# Patient Record
Sex: Female | Born: 1940 | Race: White | Hispanic: No | Smoking: Never smoker
Health system: Southern US, Community
[De-identification: ages and names within clinical notes are randomized; demographics above are authoritative.]

## PROBLEM LIST (undated history)

## (undated) DIAGNOSIS — E785 Hyperlipidemia, unspecified: Secondary | ICD-10-CM

## (undated) DIAGNOSIS — Z87442 Personal history of urinary calculi: Secondary | ICD-10-CM

## (undated) DIAGNOSIS — K219 Gastro-esophageal reflux disease without esophagitis: Secondary | ICD-10-CM

## (undated) DIAGNOSIS — M5030 Other cervical disc degeneration, unspecified cervical region: Secondary | ICD-10-CM

## (undated) DIAGNOSIS — IMO0001 Reserved for inherently not codable concepts without codable children: Secondary | ICD-10-CM

## (undated) DIAGNOSIS — M1990 Unspecified osteoarthritis, unspecified site: Secondary | ICD-10-CM

## (undated) HISTORY — DX: Other cervical disc degeneration, unspecified cervical region: M50.30

## (undated) HISTORY — DX: Hyperlipidemia, unspecified: E78.5

## (undated) HISTORY — DX: Gastro-esophageal reflux disease without esophagitis: K21.9

## (undated) HISTORY — DX: Reserved for inherently not codable concepts without codable children: IMO0001

## (undated) HISTORY — PX: OTHER SURGICAL HISTORY: SHX169

## (undated) HISTORY — DX: Unspecified osteoarthritis, unspecified site: M19.90

---

## 1998-01-04 ENCOUNTER — Other Ambulatory Visit: Admission: RE | Admit: 1998-01-04 | Discharge: 1998-01-04 | Payer: Self-pay | Admitting: Emergency Medicine

## 1999-11-28 ENCOUNTER — Other Ambulatory Visit: Admission: RE | Admit: 1999-11-28 | Discharge: 1999-11-28 | Payer: Self-pay | Admitting: Emergency Medicine

## 2000-03-30 ENCOUNTER — Encounter: Admission: RE | Admit: 2000-03-30 | Discharge: 2000-03-30 | Payer: Self-pay | Admitting: Emergency Medicine

## 2000-03-30 ENCOUNTER — Encounter: Payer: Self-pay | Admitting: Emergency Medicine

## 2001-03-29 ENCOUNTER — Other Ambulatory Visit: Admission: RE | Admit: 2001-03-29 | Discharge: 2001-03-29 | Payer: Self-pay | Admitting: Urology

## 2001-04-11 ENCOUNTER — Encounter: Payer: Self-pay | Admitting: Urology

## 2001-04-11 ENCOUNTER — Encounter: Admission: RE | Admit: 2001-04-11 | Discharge: 2001-04-11 | Payer: Self-pay | Admitting: Urology

## 2001-04-13 ENCOUNTER — Ambulatory Visit (HOSPITAL_BASED_OUTPATIENT_CLINIC_OR_DEPARTMENT_OTHER): Admission: RE | Admit: 2001-04-13 | Discharge: 2001-04-13 | Payer: Self-pay | Admitting: Urology

## 2001-04-13 ENCOUNTER — Encounter (INDEPENDENT_AMBULATORY_CARE_PROVIDER_SITE_OTHER): Payer: Self-pay | Admitting: Specialist

## 2001-08-03 HISTORY — PX: CYSTECTOMY: SUR359

## 2001-09-09 ENCOUNTER — Ambulatory Visit (HOSPITAL_COMMUNITY): Admission: RE | Admit: 2001-09-09 | Discharge: 2001-09-09 | Payer: Self-pay | Admitting: *Deleted

## 2003-01-30 ENCOUNTER — Encounter: Admission: RE | Admit: 2003-01-30 | Discharge: 2003-01-30 | Payer: Self-pay | Admitting: Family Medicine

## 2003-01-30 ENCOUNTER — Encounter: Payer: Self-pay | Admitting: Family Medicine

## 2003-08-04 HISTORY — PX: CHOLECYSTECTOMY: SHX55

## 2003-08-04 HISTORY — PX: OTHER SURGICAL HISTORY: SHX169

## 2003-11-30 ENCOUNTER — Encounter: Admission: RE | Admit: 2003-11-30 | Discharge: 2003-11-30 | Payer: Self-pay | Admitting: Family Medicine

## 2004-01-01 ENCOUNTER — Encounter (INDEPENDENT_AMBULATORY_CARE_PROVIDER_SITE_OTHER): Payer: Self-pay | Admitting: Specialist

## 2004-01-01 ENCOUNTER — Observation Stay (HOSPITAL_COMMUNITY): Admission: RE | Admit: 2004-01-01 | Discharge: 2004-01-02 | Payer: Self-pay | Admitting: Surgery

## 2004-06-19 ENCOUNTER — Other Ambulatory Visit: Admission: RE | Admit: 2004-06-19 | Discharge: 2004-06-19 | Payer: Self-pay | Admitting: *Deleted

## 2005-05-19 ENCOUNTER — Ambulatory Visit (HOSPITAL_COMMUNITY): Admission: RE | Admit: 2005-05-19 | Discharge: 2005-05-19 | Payer: Self-pay | Admitting: Family Medicine

## 2005-06-03 ENCOUNTER — Encounter: Admission: RE | Admit: 2005-06-03 | Discharge: 2005-06-03 | Payer: Self-pay | Admitting: Family Medicine

## 2005-08-20 ENCOUNTER — Other Ambulatory Visit: Admission: RE | Admit: 2005-08-20 | Discharge: 2005-08-20 | Payer: Self-pay | Admitting: Obstetrics & Gynecology

## 2006-06-14 ENCOUNTER — Ambulatory Visit (HOSPITAL_BASED_OUTPATIENT_CLINIC_OR_DEPARTMENT_OTHER): Admission: RE | Admit: 2006-06-14 | Discharge: 2006-06-14 | Payer: Self-pay | Admitting: Obstetrics and Gynecology

## 2006-06-14 ENCOUNTER — Encounter (INDEPENDENT_AMBULATORY_CARE_PROVIDER_SITE_OTHER): Payer: Self-pay | Admitting: Specialist

## 2006-10-05 ENCOUNTER — Other Ambulatory Visit: Admission: RE | Admit: 2006-10-05 | Discharge: 2006-10-05 | Payer: Self-pay | Admitting: Obstetrics and Gynecology

## 2006-10-21 ENCOUNTER — Encounter: Admission: RE | Admit: 2006-10-21 | Discharge: 2006-10-21 | Payer: Self-pay | Admitting: Obstetrics and Gynecology

## 2007-11-06 ENCOUNTER — Encounter: Admission: RE | Admit: 2007-11-06 | Discharge: 2007-11-06 | Payer: Self-pay | Admitting: Family Medicine

## 2008-01-02 ENCOUNTER — Encounter: Admission: RE | Admit: 2008-01-02 | Discharge: 2008-01-02 | Payer: Self-pay | Admitting: Obstetrics and Gynecology

## 2008-01-09 ENCOUNTER — Encounter: Admission: RE | Admit: 2008-01-09 | Discharge: 2008-01-24 | Payer: Self-pay | Admitting: Family Medicine

## 2009-09-26 ENCOUNTER — Encounter: Admission: RE | Admit: 2009-09-26 | Discharge: 2009-09-26 | Payer: Self-pay | Admitting: Obstetrics and Gynecology

## 2010-04-25 ENCOUNTER — Encounter: Admission: RE | Admit: 2010-04-25 | Discharge: 2010-04-25 | Payer: Self-pay | Admitting: Podiatry

## 2010-08-23 ENCOUNTER — Encounter: Payer: Self-pay | Admitting: Family Medicine

## 2010-08-24 ENCOUNTER — Encounter: Payer: Self-pay | Admitting: Family Medicine

## 2010-11-14 ENCOUNTER — Other Ambulatory Visit: Payer: Self-pay | Admitting: Obstetrics and Gynecology

## 2010-11-14 DIAGNOSIS — Z1231 Encounter for screening mammogram for malignant neoplasm of breast: Secondary | ICD-10-CM

## 2010-11-24 ENCOUNTER — Other Ambulatory Visit: Payer: Self-pay | Admitting: Emergency Medicine

## 2010-11-24 ENCOUNTER — Ambulatory Visit
Admission: RE | Admit: 2010-11-24 | Discharge: 2010-11-24 | Disposition: A | Discharge status: Home / Self Care | Payer: Medicare Other | Source: Ambulatory Visit | Attending: Obstetrics and Gynecology | Admitting: Obstetrics and Gynecology

## 2010-11-24 DIAGNOSIS — Z1231 Encounter for screening mammogram for malignant neoplasm of breast: Secondary | ICD-10-CM

## 2010-12-19 NOTE — Op Note (Signed)
NAMEVERENICE, WESTRICH              ACCOUNT NO.:  [id number]   MEDICAL RECORD NO.:  [id number]          PATIENT TYPE:  AMB   LOCATION:  NESC                         FACILITY:  [facility]   PHYSICIAN:  Cynthia P. Romine, M.D.DATE OF BIRTH:  [birth year]   DATE OF PROCEDURE:  06/14/2006  DATE OF DISCHARGE:                               OPERATIVE REPORT   PREOPERATIVE DIAGNOSIS:  Endometrial polyp.   POSTOPERATIVE DIAGNOSIS:  Endometrial polyp, path pending.   PROCEDURE:  Hysteroscopic resection of endometrial polyp, dilation and  curettage.   SURGEON:  Dr. Arline Asp Romine.   ANESTHESIA:  General by LMA.   ESTIMATED BLOOD LOSS:  Minimal.   SORBITOL DEFICIT:  105 mL.   COMPLICATIONS:  None.   PROCEDURE:  The patient was taken to the operating room and after  induction of adequate general anesthesia was placed in dorsal lithotomy  position and prepped and draped in usual fashion.  The bladder was  drained with a red rubber catheter.  A posterior weighted and anterior  Sims retractor were placed and the cervix was grasped on its anterior  lip with a single-tooth tenaculum.  The uterus was sounded to 8 cm.  The  cervix was dilated to #31 Shawnie Pons.  The operative hysteroscope was  introduced.  A rather large polyp was noted along the right mid aspect  of the corpus of the uterus.  On ultrasound it had measured 2.4 cm in  greatest dimension and it appeared consistent with that.  There was also  a much smaller polyp along the endocervical canal.  The larger polyp was  removed with multiple passes using the single loop cautery and after the  larger polyp was satisfactorily resected, the small polyp was also  resected, the hysteroscope was removed. sharp curettage was done.  The  specimen was all sent as one specimen to pathology.  The instruments  were removed from vagina and the procedure was terminated.  The patient  tolerated it well.  She went in satisfactory condition to post  anesthesia  recovery.      Cynthia P. Romine, M.D.  Electronically Signed     CPR/MEDQ  D:  06/14/2006  T:  06/14/2006  Job:  [id number]

## 2010-12-19 NOTE — Op Note (Signed)
NAME:  [name], [name]                        ACCOUNT NO.:  [id number]   MEDICAL RECORD NO.:  [id number]                   PATIENT TYPE:  OBV   LOCATION:  0098                                 FACILITY:  [facility]   PHYSICIAN:  Thornton Park. Daphine Deutscher, M.D.             DATE OF BIRTH:  1940-12-27   DATE OF PROCEDURE:  01/01/2004  DATE OF DISCHARGE:                                 OPERATIVE REPORT   PREOPERATIVE DIAGNOSIS:  Cholecystitis, chronic.   POSTOPERATIVE DIAGNOSIS:  Acute and subacute cholecystitis with normal  intraoperative cholangiogram.   PROCEDURE:  Laparoscopic cholecystectomy IOC.   SURGEON:  Thornton Park. Daphine Deutscher, M.D.   ASSISTANT:  Sharlet Salina T. Hoxworth, M.D.   ANESTHESIA:  General endotracheal.   DESCRIPTION OF PROCEDURE:  Cynthia Castro is a 70 year old lady, brought to  [facility] on May 31, given general anesthesia.  The abdomen was  prepped with Betadine and draped sterilely.  We made a transverse incision  through a prior laparoscopy incision and then went into the abdomen without  difficulty.  The abdomen was insufflated.  Three trocars were placed in the  upper abdomen.  The gallbladder was found to be distended, thickened,  whitish, and adherent to surrounding omentum.  This was stripped away, and I  ended up opening the gallbladder fundus with a suction trocar and aspirating  clear bile out.  The gallbladder was then grasped, elevated, and a very  meticulous, tedious dissection ensued in the infundibulum where everything  was really markedly stuck to the duodenum.  This was teased away.  Eventually, I got around the cystic duct and identified the critical view of  the artery and Calot's triangle.  I put a clip up on the gallbladder and  incised the cystic duct, got out some grunge, and inserted the Reddick  catheter, took a dynamic cholangiogram which showed proximal intrahepatic  filling as well as free flow into the duodenum.  Cystic duct was triple  clipped, divided; cystic artery was triple clipped, divided, and then the  gallbladder was removed from the gallbladder bed and although this was  fused, it was very stuck.  Once detached from the gallbladder bed, it was  placed in a bag and brought out through the umbilicus.  This was done by  having to extend my incision a little bit because the gallbladder was so  thickened.  It was inflamed; it had multiple stones.  I cauterized the  gallbladder bed and saw no evidence of bleeding or bile leaks.  I did put a  little piece of Surgicel in there just because the area had been so raw.  The umbilical port was repaired with a figure-of-eight  suture of 0 Vicryl under laparoscopic vision.  I then surveyed the ports,  withdrew them, deflated.  The skin was closed with 4-0 Vicryl with Benzoin  and Steri-Strips.  The patient seemed to tolerate the procedure well and was  taken to the recovery room in satisfactory condition.                                               Thornton Park Daphine Deutscher, M.D.    MBM/MEDQ  D:  01/01/2004  T:  01/01/2004  Job:  [id number]   cc:   Chales Salmon. Abigail Miyamoto, M.D.  [address]  [place]  [place] [address]  Fax: [phone]

## 2010-12-19 NOTE — Op Note (Signed)
[facility]  Patient:    BLANDINA, RENALDO Visit Number: [id number] MRN: [id number]          Service Type: NES Location: NESC Attending Physician:  Katherine Roan Proc. 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  OPERATION PERFORMED:  Cystoscopy, bladder biopsy x 3.  SURGEON:  Rozanna Boer., M.D.  ANESTHESIA:  General.  INDICATIONS FOR PROCEDURE:  This 70 year old white female was admitted with chronic longstanding frequency and urgency with pelvic pain.  She takes trimethoprim p.r.n. sometimes weeks at a time. She had a questionable small clot recently and on cystoscopy in the office she had an inflammatory lesion in the midtrigone and enters for biopsy of this lesion at this time.  DESCRIPTION OF PROCEDURE:  The patient was placed on the operating table in dorsal lithotomy position and after satisfactory induction of general anesthesia was prepped and draped with Betadine in the usual sterile fashion. A 21 panendoscope was inserted and then the bladder carefully inspected using the right angle and Foroblique lens.  The bladder was smooth, nontrabeculated and the only lesion seen was the midtrigone where there was about a 3 to 4 mm lesion in the midtrigone.  It took three biopsies with cold cup biopsy forceps to completely remove this after photographs were taken and the base was fulgurated with the rounded Bugbee electrode.  This effected good hemostasis. The bladder was drained and a BNO suppository inserted and the patient taken to the recovery area in good condition to be later discharged as an outpatient. Attending Physician:  Katherine Roan DD:   04/13/01 TD:  04/13/01 Job: [id number]

## 2011-09-10 ENCOUNTER — Telehealth: Payer: Self-pay

## 2011-09-10 NOTE — Telephone Encounter (Signed)
.  [facility] PT WAS SEEN BY DR [facility] AND WOULD LIKE TO KNOW IF THE RECORDS FROM DR [facility] OFFICE PLEASE CALL [phone]

## 2011-09-14 NOTE — Telephone Encounter (Signed)
Pt wants to make sure that we received her records, I spoke with the patient and let her know that the records were rec'vd and are in her chart

## 2011-09-22 ENCOUNTER — Ambulatory Visit (INDEPENDENT_AMBULATORY_CARE_PROVIDER_SITE_OTHER): Payer: Self-pay | Admitting: Family Medicine

## 2011-09-22 DIAGNOSIS — Z713 Dietary counseling and surveillance: Secondary | ICD-10-CM

## 2011-11-27 ENCOUNTER — Other Ambulatory Visit: Payer: Self-pay | Admitting: Obstetrics and Gynecology

## 2011-11-27 DIAGNOSIS — Z1231 Encounter for screening mammogram for malignant neoplasm of breast: Secondary | ICD-10-CM

## 2011-12-01 ENCOUNTER — Ambulatory Visit: Payer: Medicare Other

## 2011-12-01 ENCOUNTER — Ambulatory Visit
Admission: RE | Admit: 2011-12-01 | Discharge: 2011-12-01 | Disposition: A | Discharge status: Home / Self Care | Payer: Medicare Other | Source: Ambulatory Visit | Attending: Obstetrics and Gynecology | Admitting: Obstetrics and Gynecology

## 2011-12-01 DIAGNOSIS — Z1231 Encounter for screening mammogram for malignant neoplasm of breast: Secondary | ICD-10-CM

## 2012-04-19 ENCOUNTER — Encounter: Payer: Self-pay | Admitting: Family Medicine

## 2012-04-19 ENCOUNTER — Ambulatory Visit: Payer: Medicare Other

## 2012-04-19 ENCOUNTER — Ambulatory Visit (INDEPENDENT_AMBULATORY_CARE_PROVIDER_SITE_OTHER): Payer: Medicare Other | Admitting: Family Medicine

## 2012-04-19 VITALS — BP 112/56 | HR 52 | Temp 98.2°F | Resp 16 | Ht 64.75 in | Wt 176.0 lb

## 2012-04-19 DIAGNOSIS — M545 Low back pain, unspecified: Secondary | ICD-10-CM

## 2012-04-19 DIAGNOSIS — Z0000 Encounter for general adult medical examination without abnormal findings: Secondary | ICD-10-CM

## 2012-04-19 DIAGNOSIS — M419 Scoliosis, unspecified: Secondary | ICD-10-CM

## 2012-04-19 DIAGNOSIS — E663 Overweight: Secondary | ICD-10-CM

## 2012-04-19 DIAGNOSIS — E7800 Pure hypercholesterolemia, unspecified: Secondary | ICD-10-CM

## 2012-04-19 DIAGNOSIS — L989 Disorder of the skin and subcutaneous tissue, unspecified: Secondary | ICD-10-CM

## 2012-04-19 LAB — COMPREHENSIVE METABOLIC PANEL
AST: 16 U/L (ref 0–37)
Albumin: 4.6 g/dL (ref 3.5–5.2)
BUN: 19 mg/dL (ref 6–23)
CO2: 27 mEq/L (ref 19–32)
Calcium: 9.6 mg/dL (ref 8.4–10.5)
Chloride: 107 mEq/L (ref 96–112)
Creat: 0.77 mg/dL (ref 0.50–1.10)
Glucose, Bld: 80 mg/dL (ref 70–99)
Potassium: 4.3 mEq/L (ref 3.5–5.3)
Sodium: 141 mEq/L (ref 135–145)

## 2012-04-19 LAB — CBC WITH DIFFERENTIAL/PLATELET
Basophils Absolute: 0 10*3/uL (ref 0.0–0.1)
Basophils Relative: 1 % (ref 0–1)
Eosinophils Absolute: 0.2 10*3/uL (ref 0.0–0.7)
HCT: 44.5 % (ref 36.0–46.0)
Hemoglobin: 14.9 g/dL (ref 12.0–15.0)
Lymphocytes Relative: 49 % — ABNORMAL HIGH (ref 12–46)
MCH: 29.2 pg (ref 26.0–34.0)
MCHC: 33.5 g/dL (ref 30.0–36.0)
Platelets: 282 10*3/uL (ref 150–400)
RDW: 13.6 % (ref 11.5–15.5)
WBC: 6.5 10*3/uL (ref 4.0–10.5)

## 2012-04-19 LAB — LIPID PANEL
LDL Cholesterol: 135 mg/dL — ABNORMAL HIGH (ref 0–99)
Triglycerides: 126 mg/dL (ref <150)

## 2012-04-19 MED ORDER — DICLOFENAC SODIUM 75 MG PO TBEC: 75.0000 mg | DELAYED_RELEASE_TABLET | Freq: Two times a day (BID) | ORAL | Status: DC

## 2012-04-19 NOTE — Progress Notes (Signed)
Subjective:    Patient ID: Cynthia Castro, female    DOB: 08-28-1940, 71 y.o.   MRN: [id number]  HPI   This 71 y.o. Cauc female is here for CPE; PAP/breast exam done by GYN. 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 Pt has minimal pain  with this and takes Diclofenac 2x /week as needed. She finds tumeric helpful.    Pt is divorced and works part-time as an Programmer, multimedia. Exercise consists of yoga and walking. She is a  nonsmoker but does consume alcohol 1-2 x/week.   MMG: 12/01/2011  Colonoscopy: 01/14/2012    Review of Systems  Constitutional: Negative.   HENT: Positive for neck pain and neck stiffness.        Has C-spine DDD.  Eyes: Negative.        Has exam scheduled for tomorrow.  Respiratory: Negative.   Cardiovascular: Negative.   Gastrointestinal: Negative.   Genitourinary: Negative.   Skin: Negative.        Lesions on back for years- would like to have these evaluated.  Neurological: Negative.   Hematological: Negative.   Psychiatric/Behavioral: Negative.        Objective:   Physical Exam  Constitutional: She is oriented to person, place, and time. She appears well-developed and well-nourished. No distress.  HENT:  Head: Normocephalic and atraumatic.  Right Ear: Hearing, tympanic membrane, external ear and ear canal normal.  Left Ear: Hearing, tympanic membrane, external ear and ear canal normal.  Nose: Nose normal. No nasal deformity or septal deviation.  Mouth/Throat: Uvula is midline, oropharynx is clear and moist and mucous membranes are normal. No oral lesions. Normal dentition.  Eyes: Conjunctivae normal, EOM and lids are normal. Pupils are equal, round, and reactive to light. No scleral icterus.  Neck: Normal range of motion. Neck supple. No thyromegaly present.  Cardiovascular: Normal rate, regular rhythm and normal heart sounds.  Exam reveals no gallop and no friction rub.   No murmur  heard. Pulmonary/Chest: Effort normal and breath sounds normal. No respiratory distress.  Abdominal: Soft. She exhibits no distension, no pulsatile midline mass and no mass. There is no hepatosplenomegaly. There is no tenderness. There is no guarding and no CVA tenderness.  Genitourinary:       Per GYN  Musculoskeletal: Normal range of motion. She exhibits tenderness. She exhibits no edema.       Back-lumbar spine has very firm area to left of midline (feels like a subcutaneous mass)  Lumbar spine- scoliosis  Neck- posterior cervical and supraspinatous muscles tender  Lymphadenopathy:    She has no cervical adenopathy.  Neurological: She is alert and oriented to person, place, and time. She has normal reflexes. No cranial nerve deficit. She exhibits normal muscle tone. Coordination normal.  Skin: Skin is warm and dry. No pallor.       Back- multiple SKs and other smaller pigmented lesions  Psychiatric: She has a normal mood and affect. Her behavior is normal. Judgment and thought content normal.    [facility] reading (PRIMARY) by  Dr. Audria Nine: Lumbar spine- moderately severe scoliosis with deg disc disease at multiple levels.       Assessment & Plan:   1. Routine general medical examination at a health care facility  Comprehensive metabolic panel, CBC with Differential  2. Hypercholesteremia  Lipid panel  3. Low back pain - scoliosis- pt very surprised to have this diagnosis; she wonders why this has not been found sooner but she  is minimally symptomatic DG Lumbar Spine 2-3 Views Reassurance; advised to continue current yoga and other fitness practices as this has been very beneficial for her. Should she become symptomatic, I have suggested PT evaluation.   4. Skin lesions, generalized  Ambulatory referral to Dermatology   Pt given RX: Zostavax x 1 dose.

## 2012-04-19 NOTE — Patient Instructions (Addendum)
Keeping You Healthy  Get These Tests  Blood Pressure- Have your blood pressure checked by your healthcare provider at least once a year.  Normal blood pressure is 120/80.  Weight- Have your body mass index (BMI) calculated to screen for obesity.  BMI is a measure of body fat based on height and weight.  You can calculate your own BMI at https://www.west-esparza.com/  Cholesterol- Have your cholesterol checked every year.  Diabetes- Have your blood sugar checked every year if you have high blood pressure, high cholesterol, a family history of diabetes or if you are overweight.  Pap Smear- Have a pap smear every 1 to 3 years if you have been sexually active.  If you are older than 65 and recent pap smears have been normal you may not need additional pap smears.  In addition, if you have had a hysterectomy  For benign disease additional pap smears are not necessary.  Mammogram-Yearly mammograms are essential for early detection of breast cancer  Screening for Colon Cancer- Colonoscopy starting at age 90. Screening may begin sooner depending on your family history and other health conditions.  Follow up colonoscopy as directed by your Gastroenterologist.  Screening for Osteoporosis- Screening begins at age 2 with bone density scanning, sooner if you are at higher risk for developing Osteoporosis.  Get these medicines  Calcium with Vitamin D- Your body requires 1200-1500 mg of Calcium a day and [phone] IU of Vitamin D a day.  You can only absorb 500 mg of Calcium at a time therefore Calcium must be taken in 2 or 3 separate doses throughout the day.  Hormones- Hormone therapy has been associated with increased risk for certain cancers and heart disease.  Talk to your healthcare provider about if you need relief from menopausal symptoms.  Aspirin- Ask your healthcare provider about taking Aspirin to prevent Heart Disease and Stroke.  Get these Immuniztions  Flu shot- Every fall  Pneumonia  shot- Once after the age of 78; if you are younger ask your healthcare provider if you need a pneumonia shot.  Tetanus- Every ten years.  Zostavax- Once after the age of 54 to prevent shingles. You were given this RX today to [facility] to [facility].  Take these steps  Don't smoke- Your healthcare provider can help you quit. For tips on how to quit, ask your healthcare provider or go to www.smokefree.gov or call 1-800 QUIT-NOW.  Be physically active- Exercise 5 days a week for a minimum of 30 minutes.  If you are not already physically active, start slow and gradually work up to 30 minutes of moderate physical activity.  Try walking, dancing, bike riding, swimming, etc.  Eat a healthy diet- Eat a variety of healthy foods such as fruits, vegetables, whole grains, low fat milk, low fat cheeses, yogurt, lean meats, chicken, fish, eggs, dried beans, tofu, etc.  For more information go to www.thenutritionsource.org  Dental visit- Brush and floss teeth twice daily; visit your dentist twice a year.  Eye exam- Visit your Optometrist or Ophthalmologist yearly.  Drink alcohol in moderation- Limit alcohol intake to one drink or less a day.  Never drink and drive.  Depression- Your emotional health is as important as your physical health.  If you're feeling down or losing interest in things you normally enjoy, please talk to your healthcare provider.  Seat Belts- can save your life; always wear one  Smoke/Carbon Monoxide detectors- These detectors need to be installed on the appropriate level of your home.  Replace batteries at least once a year.  Violence- If anyone is threatening or hurting you, please tell your healthcare provider.  Living Will/ Health care power of attorney- Discuss with your healthcare provider and family.     Shingles Vaccine What You Need to Know WHAT IS SHINGLES?  Shingles is a painful skin rash, often with blisters. It is also called Herpes Zoster or just Zoster.    A shingles rash usually appears on one side of the face or body and lasts from 2 to 4 weeks. Its main symptom is pain, which can be quite severe. Other symptoms of shingles can include fever, headache, chills, and upset stomach. Very rarely, a shingles infection can lead to pneumonia, hearing problems, blindness, brain inflammation (encephalitis), or death.   For about 1 person in 5, severe pain can continue even after the rash clears up. This is called post-herpetic neuralgia.   Shingles is caused by the Varicella Zoster virus. This is the same virus that causes chickenpox. Only someone who has had a case of chickenpox or rarely, has gotten chickenpox vaccine, can get shingles. The virus stays in your body. It can reappear many years later to cause a case of shingles.   You cannot catch shingles from another person with shingles. However, a person who has never had chickenpox (or chickenpox vaccine) could get chickenpox from someone with shingles. This is not very common.   Shingles is far more common in people 22 and older than in younger people. It is also more common in people whose immune systems are weakened because of a disease such as cancer or drugs such as steroids or chemotherapy.   At least 1 million people get shingles per year in the [place].  SHINGLES VACCINE  A vaccine for shingles was licensed in 2006. In clinical trials, the vaccine reduced the risk of shingles by 50%. It can also reduce the pain in people who still get shingles after being vaccinated.   A single dose of shingles vaccine is recommended for adults 12 years of age and older.  SOME PEOPLE SHOULD NOT GET SHINGLES VACCINE OR SHOULD WAIT A person should not get shingles vaccine if he or she:  Has ever had a life-threatening allergic reaction to gelatin, the antibiotic neomycin, or any other component of shingles vaccine. Tell your caregiver if you have any severe allergies.   Has a weakened immune system  because of current:   AIDS or another disease that affects the immune system.   Treatment with drugs that affect the immune system, such as prolonged use of high-dose steroids.   Cancer treatment, such as radiation or chemotherapy.   Cancer affecting the bone marrow or lymphatic system, such as leukemia or lymphoma.   Is pregnant, or might be pregnant. Women should not become pregnant until at least 4 weeks after getting shingles vaccine.  Someone with a minor illness, such as a cold, may be vaccinated. Anyone with a moderate or severe acute illness should usually wait until he or she recovers before getting the vaccine. This includes anyone with a temperature of 101.3 F (38 C) or higher. WHAT ARE THE RISKS FROM SHINGLES VACCINE?  A vaccine, like any medicine, could possibly cause serious problems, such as severe allergic reactions. However, the risk of a vaccine causing serious harm, or death, is extremely small.   No serious problems have been identified with shingles vaccine.  Mild Problems  Redness, soreness, swelling, or itching at the site of the  injection (about 1 person in 3).   Headache (about 1 person in 70).  Like all vaccines, shingles vaccine is being closely monitored for unusual or severe problems. WHAT IF THERE IS A MODERATE OR SEVERE REACTION? What should I look for? Any unusual condition, such as a severe allergic reaction or a high fever. If a severe allergic reaction occurred, it would be within a few minutes to an hour after the shot. Signs of a serious allergic reaction can include difficulty breathing, weakness, hoarseness or wheezing, a fast heartbeat, hives, dizziness, paleness, or swelling of the throat. What should I do?  Call your caregiver, or get the person to a caregiver right away.   Tell the caregiver what happened, the date and time it happened, and when the vaccination was given.   Ask the caregiver to report the reaction by filing a Vaccine  Adverse Event Reporting System (VAERS) form. Or, you can file this report through the VAERS web site at www.vaers.LAgents.no or by calling 1-641-357-0916.  VAERS does not provide medical advice. HOW CAN I LEARN MORE?  Ask your caregiver. He or she can give you the vaccine package insert or suggest other sources of information.   Contact the Centers for Disease Control and Prevention (CDC):   Call 571 389 4811 (1-800-CDC-INFO).   Visit the CDC website at PicCapture.uy  CDC Shingles Vaccine VIS (05/08/08) Document Released: 05/17/2006 Document Revised: 07/09/2011 Document Reviewed: 05/08/2008 [facility] Patient Information 2012 [place], [place].   Scoliosis Scoliosis is the name given to a spine that curves sideways. It is a common condition found in up to ten percent of adolescents. It is more common in teenage girls. This is sometimes the result of other underlying problems such as unequal leg length or muscular problems. Approximately 70% of the time the cause unknown. It can cause twisting of the shoulders, hips, chest, back, and rib cage. Exercises generally do not affect the course of this disease, but may be helpful in strengthening weak muscle groups. Orthopedic braces may be needed during growth spurts. Surgery may be necessary for progressive cases. HOME CARE INSTRUCTIONS   Your caregiver may suggest exercises to strengthen your muscles. Follow their instructions. Ask your caregiver if you can participate in sports activities.   Bracing may be needed to try to limit the progression of the spinal curve. Wear the brace as instructed by your caregiver.   Follow-up appointments are important. Often mild cases of scoliosis can be kept track of by regular physical exams. However, periodic x-rays may be taken in more severe cases to follow the progress of the curvature, especially with brace treatment. Scoliosis can be corrected or improved if treated early.  SEEK IMMEDIATE MEDICAL CARE  IF:  You have back pain that is not relieved by medications prescribed by your caregiver.   If there is weakness or increased muscle tone (spasticity) in your legs or any loss of bowel or bladder control.  Document Released: 07/17/2000 Document Revised: 07/09/2011 Document Reviewed: 08/06/2008 [facility] Patient Information 2012 [place], [place].

## 2012-04-21 ENCOUNTER — Encounter: Payer: Self-pay | Admitting: Family Medicine

## 2012-04-21 DIAGNOSIS — E7800 Pure hypercholesterolemia, unspecified: Secondary | ICD-10-CM | POA: Insufficient documentation

## 2012-04-21 DIAGNOSIS — M419 Scoliosis, unspecified: Secondary | ICD-10-CM | POA: Insufficient documentation

## 2012-04-21 DIAGNOSIS — E663 Overweight: Secondary | ICD-10-CM | POA: Insufficient documentation

## 2012-04-21 NOTE — Progress Notes (Signed)
Quick Note:  Please call pt and advise that the following labs are abnormal...  Chemistries look great but Total cholesterol and LDL ("bad") cholesterol are above normal. Continue to exercise , reduce weight and try to improve your nutrition with low fat, non-processed foods. Increase fruits, vegetables, whole grains, seafood and chicken (not fried) and "good" fats like olive oil and nuts (walnuts, almonds, cashews- if you are not allergic) Get OTC Fish Oil capsule 1200 mg and take 1 daily.  You have an "average" risk for Heart disease with the current cholesterol level. These numbers can be rechecked in 12 months.  Copy to pt. ______

## 2012-04-22 ENCOUNTER — Encounter: Payer: Self-pay | Admitting: *Deleted

## 2012-06-27 ENCOUNTER — Encounter: Payer: Self-pay | Admitting: Family Medicine

## 2012-06-27 DIAGNOSIS — L821 Other seborrheic keratosis: Secondary | ICD-10-CM | POA: Insufficient documentation

## 2012-08-24 ENCOUNTER — Ambulatory Visit (INDEPENDENT_AMBULATORY_CARE_PROVIDER_SITE_OTHER): Payer: Medicare Other | Admitting: Family Medicine

## 2012-08-24 ENCOUNTER — Encounter: Payer: Self-pay | Admitting: Family Medicine

## 2012-08-24 VITALS — BP 107/63 | HR 67 | Temp 98.2°F | Resp 16 | Ht 65.5 in | Wt 174.0 lb

## 2012-08-24 DIAGNOSIS — K5730 Diverticulosis of large intestine without perforation or abscess without bleeding: Secondary | ICD-10-CM

## 2012-08-24 DIAGNOSIS — R1013 Epigastric pain: Secondary | ICD-10-CM

## 2012-08-24 NOTE — Progress Notes (Signed)
S:  This 72 y.o. Cauc female had GI upset with epig pain and loose "orange" stool about 12 days ago. Symptoms lasted about 4 days; pain seemed to migrate to LUQ then resolved. She has been taking licorice product whichhas helped. She takes prescription NSAID maybe twice a week with glass of milk at bedtime. She denies n/v, fever/ chills, diaphoresis, abd pain radiating to back, hematemesis or melena, palpitations, CP or tightness, SOB or DOE, weakness or syncope.  Pt had colonoscopy in 2012 or 2013 performed by Dr. Lanny Hurst diverticulosis in sigmoid colon and internal hemorrhoids).  ROS: As per HPI.   O:  Filed Vitals:   08/24/12 1130  BP: 107/63  Pulse: 67  Temp: 98.2 F (36.8 C)  Resp: 16   GEN: In NAD; WN,WD. HEENT: [place]/AT; EOMI w/ clear conj/sclerae. EACs/TMs normal. Post ph clear w/o erythema or exudate. NECK: No LAN or TMG. COR: RRR; Normal S1,S2. No m,g,r. LUNGS: CTA; normal resp rate and effort. BACK: No CVAT. ABD: Normal appearance, decreased BS w/o guarding, masses or HSM. No epig tenderness. SKIN: W&D; no rashes or pallor. NEURO: A&O x 3; CNs intact. Nonfocal.   ECG: NSR (bradycardia); No ST-TW changes.  A/P: 1. Abdominal pain, acute, epigastric  EKG 12-Lead, H. pylori antibody, IgG  2. Diverticulosis of sigmoid colon      Pt can continue current measures pending lab results. Advised to take NSAID w/ food.

## 2012-08-24 NOTE — Patient Instructions (Addendum)
Your ECG is normal. I suspect you had a GI virus that has been going around Your colonoscopy did show diverticulosis so it is important to be careful about your diet..  Diverticulosis Diverticulosis is a common condition that develops when small pouches (diverticula) form in the wall of the colon. The risk of diverticulosis increases with age. It happens more often in people who eat a low-fiber diet. Most individuals with diverticulosis have no symptoms. Those individuals with symptoms usually experience abdominal pain, constipation, or loose stools (diarrhea). HOME CARE INSTRUCTIONS   Increase the amount of fiber in your diet as directed by your caregiver or dietician. This may reduce symptoms of diverticulosis.  Your caregiver may recommend taking a dietary fiber supplement.  Drink at least 6 to 8 glasses of water each day to prevent constipation.  Try not to strain when you have a bowel movement.  Your caregiver may recommend avoiding nuts and seeds to prevent complications, although this is still an uncertain benefit.  Only take over-the-counter or prescription medicines for pain, discomfort, or fever as directed by your caregiver. FOODS WITH HIGH FIBER CONTENT INCLUDE:  Fruits. Apple, peach, pear, tangerine, raisins, prunes.  Vegetables. Brussels sprouts, asparagus, broccoli, cabbage, carrot, cauliflower, romaine lettuce, spinach, summer squash, tomato, winter squash, zucchini.  Starchy Vegetables. Baked beans, kidney beans, lima beans, split peas, lentils, potatoes (with skin).  Grains. Whole wheat bread, brown rice, bran flake cereal, plain oatmeal, white rice, shredded wheat, bran muffins. SEEK IMMEDIATE MEDICAL CARE IF:   You develop increasing pain or severe bloating.  You have an oral temperature above 102 F (38.9 C), not controlled by medicine.  You develop vomiting or bowel movements that are bloody or black. Document Released: 04/16/2004 Document Revised: 10/12/2011  Document Reviewed: 12/18/2009 [employer] Patient Information 2013 [employer], [place]   Indigestion Indigestion is discomfort in the upper abdomen that is caused by underlying problems such as gastroesophageal reflux disease (GERD), ulcers, or gallbladder problems.  CAUSES  Indigestion can be caused by many things. Possible causes include:  Stomach acid in the esophagus.  Stomach infections, usually caused by the bacteria H. pylori.  Being overweight.  Hiatal hernia. This means part of the stomach pushes up through the diaphragm.  Overeating.  Emotional problems, such as stress, anxiety, or depression.  Poor nutrition.  Consuming too much alcohol, tobacco, or caffeine.  Consuming spicy foods, fats, peppermint, chocolate, tomato products, citrus, or fruit juices.  Medicines such as aspirin and other anti-inflammatory drugs, hormones, steroids, and thyroid medicines.  Gastroparesis. This is a condition in which the stomach does not empty properly.  Stomach cancer.  Pregnancy, due to an increase in hormone levels, a relaxation of muscles in the digestive tract, and pressure on the stomach from the growing fetus. SYMPTOMS   Uncomfortable feeling of fullness after eating.  Pain or burning sensation in the upper abdomen.  Bloating.  Belching and gas.  Nausea and vomiting.  Acidic taste in the mouth.  Burning sensation in the chest (heartburn). DIAGNOSIS  Your caregiver will review your medical history and perform a physical exam. Other tests, such as blood tests, stool tests, X-rays, and other imaging scans, may be done to check for more serious problems. TREATMENT  Liquid antacids and other drugs may be given to block stomach acid secretion. Medicines that increase esophageal muscle tone may also be given to help reduce symptoms. If an infection is found, antibiotic medicine may be given. HOME CARE INSTRUCTIONS  Avoid foods and drinks that make your  symptoms worse, such  as:  Caffeine or alcoholic drinks.  Chocolate.  Peppermint or mint flavorings.  Garlic and onions.  Spicy foods.  Citrus fruits, such as oranges, lemons, or limes.  Tomato-based foods such as sauce, chili, salsa, and pizza.  Fried and fatty foods.  Avoid eating for the 3 hours prior to your bedtime.  Eat small, frequent meals instead of large meals.  Stop smoking if you smoke.  Maintain a healthy weight.  Wear loose-fitting clothing. Do not wear anything tight around your waist that causes pressure on your stomach.  Raise the head of your bed 4 to 8 inches with wood blocks to help you sleep. Extra pillows will not help.  Only take over-the-counter or prescription medicines as directed by your caregiver.  Do not take aspirin, ibuprofen, or other nonsteroidal anti-inflammatory drugs (NSAIDs). SEEK IMMEDIATE MEDICAL CARE IF:   You are not better after 2 days.  You have chest pressure or pain that radiates up into your neck, arms, back, jaw, or upper abdomen.  You have difficulty swallowing.  You keep vomiting.  You have black or bloody stools.  You have a fever.  You have dizziness, fainting, difficulty breathing, or heavy sweating.  You have severe abdominal pain.  You lose weight without trying. MAKE SURE YOU:  Understand these instructions.  Will watch your condition.  Will get help right away if you are not doing well or get worse. Document Released: 08/27/2004 Document Revised: 10/12/2011 Document Reviewed: 03/04/2011 [facility] Patient Information 2013 [place], [place].

## 2012-08-25 LAB — H. PYLORI ANTIBODY, IGG: H Pylori IgG: 0.8 {ISR}

## 2012-08-25 NOTE — Progress Notes (Signed)
Quick Note:  Please call pt and advise that the lab test for H. Pylori bacteria (found in stomach) is Negative.   She can continue with recommended OTC medication and diet modifications as addressed in handout.   Copy to pt.  ______

## 2012-12-21 ENCOUNTER — Encounter: Payer: Self-pay | Admitting: *Deleted

## 2012-12-22 ENCOUNTER — Ambulatory Visit (INDEPENDENT_AMBULATORY_CARE_PROVIDER_SITE_OTHER): Payer: Medicare Other | Admitting: Obstetrics and Gynecology

## 2012-12-22 ENCOUNTER — Encounter: Payer: Self-pay | Admitting: Nurse Practitioner

## 2012-12-22 VITALS — BP 114/60 | HR 68 | Ht 66.0 in | Wt 178.4 lb

## 2012-12-22 DIAGNOSIS — Z23 Encounter for immunization: Secondary | ICD-10-CM

## 2012-12-22 DIAGNOSIS — N812 Incomplete uterovaginal prolapse: Secondary | ICD-10-CM

## 2012-12-22 DIAGNOSIS — N952 Postmenopausal atrophic vaginitis: Secondary | ICD-10-CM

## 2012-12-22 DIAGNOSIS — N816 Rectocele: Secondary | ICD-10-CM | POA: Insufficient documentation

## 2012-12-22 DIAGNOSIS — R350 Frequency of micturition: Secondary | ICD-10-CM

## 2012-12-22 DIAGNOSIS — Z01419 Encounter for gynecological examination (general) (routine) without abnormal findings: Secondary | ICD-10-CM

## 2012-12-22 MED ORDER — ESTROGENS, CONJUGATED 0.625 MG/GM VA CREA: TOPICAL_CREAM | Freq: Every day | VAGINAL | Status: DC

## 2012-12-22 NOTE — Progress Notes (Signed)
72 y.o. G4P3 Married Caucasian Fe here for annual exam.    Patient stopped vaginal estrogen cream but felt pressure vaginally so she restarted.  Doing Kegels. Urinating at night and voids only small amounts.  1 - 4 times a night. Urinary frequency during the day varies. No dysuria. No hematuria. Can have some discomfort in left lower quadrant, most often relieved by bowel movements.   Bowel movements relieve the pressure feeling.  No constipation. Patient had a known cystocele.    PCP told her she has scoliosis.    Patient had an episode of fleeting pain in left side when was at a a Ambulance person.  Was strong pain but has been nonrecurrent.    No LMP recorded. Patient is postmenopausal.          Sexually active: no  The current method of family planning is post menopausal status.    Exercising: yes  yoga and walk Smoker:  no  Health Maintenance: Pap:  10/31/2009  Normal .  No history of abnormal pap smears. MMG:  12/2011 Colonoscopy:  01/2011 normal BMD:   2009 TDaP:  PCP updates TDaP, per pt.   Patient unsure when it was last performed.   Labs: PCP does lab (blood) work.    reports that she has never smoked. She has never used smokeless tobacco. She reports that she drinks about 1.2 ounces of alcohol per week.  Past Medical History  Diagnosis Date  . Arthritis   . Hyperlipidemia   . DDD (degenerative disc disease), cervical     Past Surgical History  Procedure Laterality Date  . Cholecystectomy  2005  . Cesarean section  1984  . Cystectomy  2003  . Uterine cyst removal  2005  . Hysteroscopic resection      hysteroscopic resection of polyp    Current Outpatient Prescriptions  Medication Sig Dispense Refill  . Estrogens, Conjugated (PREMARIN VA) Place vaginally. USE ONCE A WEEK      . fish oil-omega-3 fatty acids 1000 MG capsule Take 2 g by mouth daily.      . TURMERIC PO Take by mouth. CAPSULE      . diclofenac (FLECTOR) 1.3 % PTCH Place 1 patch onto the skin as  needed.      . diclofenac (VOLTAREN) 75 MG EC tablet Take 1 tablet (75 mg total) by mouth 2 (two) times daily.  60 tablet  5  . folic acid (FOLVITE) 1 MG tablet Take 1 mg by mouth daily.      . Multiple Vitamins-Minerals (MULTIVITAMIN PO) Take by mouth daily.       No current facility-administered medications for this visit.    Family History  Problem Relation Age of Onset  . Diabetes Mother   . Heart disease Mother     CHF  . Vision loss Mother   . Cancer Father     TESTICULAR  . Stroke Father 64  . Diabetes Maternal Grandmother   . Heart disease Maternal Grandfather     ROS:  Pertinent items are noted in HPI.  Otherwise, a comprehensive ROS was negative.  Exam:   BP 114/60  Pulse 68  Ht 5\' 6"  (1.676 m)  Wt 178 lb 6.4 oz (80.922 kg)  BMI 28.81 kg/m2 Height: 5\' 6"  (167.6 cm)  Ht Readings from Last 3 Encounters:  12/22/12 5\' 6"  (1.676 m)  08/24/12 5' 5.5" (1.664 m)  04/19/12 5' 4.75" (1.645 m)    General appearance: alert, cooperative and appears stated age  Head: Normocephalic, without obvious abnormality, atraumatic Neck: no adenopathy, supple, symmetrical, trachea midline and thyroid normal to inspection and palpation Lungs: clear to auscultation bilaterally Breasts: normal appearance, no masses or tenderness, No nipple retraction or dimpling, No axillary or supraclavicular adenopathy Heart: regular rate and rhythm Abdomen: soft, non-tender; no masses,  no organomegaly Extremities: extremities normal, atraumatic, no cyanosis or edema Skin: Skin color, texture, turgor normal. No rashes or lesions Lymph nodes: Cervical, supraclavicular, and axillary nodes normal. No abnormal inguinal nodes palpated Neurologic: Grossly normal   Pelvic: External genitalia:  no lesions              Urethra:  normal appearing urethra with no masses, tenderness or lesions              Bartholin's and Skene's: normal                 Vagina: normal appearing vagina with erythema of vaginal  cuff, no lesions.  Second degree rectocele.  Good anterior vaginal support.              Cervix: no lesions              Pap taken: no Bimanual Exam:  Uterus:  normal size, contour, position, consistency, mobility, non-tender              Adnexa: normal adnexa and no mass, fullness, tenderness               Rectovaginal: Confirms               Anus:  normal sphincter tone, no lesions  A:  Well Woman with normal exam Atrophic vaginitis Urinary frequency Incomplete uterovaginal prolapse.  Second degree rectocele noted.  P:   Pap smear not needed per guidelines  Mammogram ordered.  Patient will call to schedule. Premarin Rx.  See Epic orders TDap today. Discussion with patient regarding pelvic organ prolapse, etiologies, symptom control and treatment option.  ACOG handout also given to patient. Discussed bowel regimens to avoid straining. Discussed bladder irritants with patient. return annually or prn  An After Visit Summary was printed and given to the patient.

## 2012-12-22 NOTE — Patient Instructions (Addendum)
EXERCISE AND DIET:  We recommended that you start or continue a regular exercise program for good health. Regular exercise means any activity that makes your heart beat faster and makes you sweat.  We recommend exercising at least 30 minutes per day at least 3 days a week, preferably 4 or 5.  We also recommend a diet low in fat and sugar.  Inactivity, poor dietary choices and obesity can cause diabetes, heart attack, stroke, and kidney damage, among others.    ALCOHOL AND SMOKING:  Women should limit their alcohol intake to no more than 7 drinks/beers/glasses of wine (combined, not each!) per week. Moderation of alcohol intake to this level decreases your risk of breast cancer and liver damage. And of course, no recreational drugs are part of a healthy lifestyle.  And absolutely no smoking or even second hand smoke. Most people know smoking can cause heart and lung diseases, but did you know it also contributes to weakening of your bones? Aging of your skin?  Yellowing of your teeth and nails?  CALCIUM AND VITAMIN D:  Adequate intake of calcium and Vitamin D are recommended.  The recommendations for exact amounts of these supplements seem to change often, but generally speaking 600 mg of calcium (either carbonate or citrate) and 800 units of Vitamin D per day seems prudent. Certain women may benefit from higher intake of Vitamin D.  If you are among these women, your doctor will have told you during your visit.    PAP SMEARS:  Pap smears, to check for cervical cancer or precancers,  have traditionally been done yearly, although recent scientific advances have shown that most women can have pap smears less often.  However, every woman still should have a physical exam from her gynecologist every year. It will include a breast check, inspection of the vulva and vagina to check for abnormal growths or skin changes, a visual exam of the cervix, and then an exam to evaluate the size and shape of the uterus and  ovaries.  And after 71 years of age, a rectal exam is indicated to check for rectal cancers. We will also provide age appropriate advice regarding health maintenance, like when you should have certain vaccines, screening for sexually transmitted diseases, bone density testing, colonoscopy, mammograms, etc.   MAMMOGRAMS:  All women over 40 years old should have a yearly mammogram. Many facilities now offer a "3D" mammogram, which may cost around $50 extra out of pocket. If possible,  we recommend you accept the option to have the 3D mammogram performed.  It both reduces the number of women who will be called back for extra views which then turn out to be normal, and it is better than the routine mammogram at detecting truly abnormal areas.    COLONOSCOPY:  Colonoscopy to screen for colon cancer is recommended for all women at age 50.  We know, you hate the idea of the prep.  We agree, BUT, having colon cancer and not knowing it is worse!!  Colon cancer so often starts as a polyp that can be seen and removed at colonscopy, which can quite literally save your life!  And if your first colonoscopy is normal and you have no family history of colon cancer, most women don't have to have it again for 10 years.  Once every ten years, you can do something that may end up saving your life, right?  We will be happy to help you get it scheduled when you are ready.    Be sure to check your insurance coverage so you understand how much it will cost.  It may be covered as a preventative service at no cost, but you should check your particular policy.    Tetanus, Diphtheria, Pertussis (Tdap) Vaccine What You Need to Know WHY GET VACCINATED? Tetanus, diphtheria and pertussis can be very serious diseases, even for adolescents and adults. Tdap vaccine can protect us from these diseases. TETANUS (Lockjaw) causes painful muscle tightening and stiffness, usually all over the body.  It can lead to tightening of muscles in the head  and neck so you can't open your mouth, swallow, or sometimes even breathe. Tetanus kills about 1 out of 5 people who are infected. DIPHTHERIA can cause a thick coating to form in the back of the throat.  It can lead to breathing problems, paralysis, heart failure, and death. PERTUSSIS (Whooping Cough) causes severe coughing spells, which can cause difficulty breathing, vomiting and disturbed sleep.  It can also lead to weight loss, incontinence, and rib fractures. Up to 2 in 100 adolescents and 5 in 100 adults with pertussis are hospitalized or have complications, which could include pneumonia and death. These diseases are caused by bacteria. Diphtheria and pertussis are spread from person to person through coughing or sneezing. Tetanus enters the body through cuts, scratches, or wounds. Before vaccines, the [place] saw as many as 200,000 cases a year of diphtheria and pertussis, and hundreds of cases of tetanus. Since vaccination began, tetanus and diphtheria have dropped by about 99% and pertussis by about 80%. TDAP VACCINE Tdap vaccine can protect adolescents and adults from tetanus, diphtheria, and pertussis. One dose of Tdap is routinely given at age 11 or 12. People who did not get Tdap at that age should get it as soon as possible. Tdap is especially important for health care professionals and anyone having close contact with a baby younger than 12 months. Pregnant women should get a dose of Tdap during every pregnancy, to protect the newborn from pertussis. Infants are most at risk for severe, life-threatening complications from pertussis. A similar vaccine, called Td, protects from tetanus and diphtheria, but not pertussis. A Td booster should be given every 10 years. Tdap may be given as one of these boosters if you have not already gotten a dose. Tdap may also be given after a severe cut or burn to prevent tetanus infection. Your doctor can give you more information. Tdap may safely  be given at the same time as other vaccines. SOME PEOPLE SHOULD NOT GET THIS VACCINE  If you ever had a life-threatening allergic reaction after a dose of any tetanus, diphtheria, or pertussis containing vaccine, OR if you have a severe allergy to any part of this vaccine, you should not get Tdap. Tell your doctor if you have any severe allergies.  If you had a coma, or long or multiple seizures within 7 days after a childhood dose of DTP or DTaP, you should not get Tdap, unless a cause other than the vaccine was found. You can still get Td.  Talk to your doctor if you:  have epilepsy or another nervous system problem,  had severe pain or swelling after any vaccine containing diphtheria, tetanus or pertussis,  ever had Guillain-Barr Syndrome (GBS),  aren't feeling well on the day the shot is scheduled. RISKS OF A VACCINE REACTION With any medicine, including vaccines, there is a chance of side effects. These are usually mild and go away on their own, but serious   reactions are also possible. Brief fainting spells can follow a vaccination, leading to injuries from falling. Sitting or lying down for about 15 minutes can help prevent these. Tell your doctor if you feel dizzy or light-headed, or have vision changes or ringing in the ears. Mild problems following Tdap (Did not interfere with activities)  Pain where the shot was given (about 3 in 4 adolescents or 2 in 3 adults)  Redness or swelling where the shot was given (about 1 person in 5)  Mild fever of at least 100.67F (up to about 1 in 25 adolescents or 1 in 100 adults)  Headache (about 3 or 4 people in 10)  Tiredness (about 1 person in 3 or 4)  Nausea, vomiting, diarrhea, stomach ache (up to 1 in 4 adolescents or 1 in 10 adults)  Chills, body aches, sore joints, rash, swollen glands (uncommon) Moderate problems following Tdap (Interfered with activities, but did not require medical attention)  Pain where the shot was given  (about 1 in 5 adolescents or 1 in 100 adults)  Redness or swelling where the shot was given (up to about 1 in 16 adolescents or 1 in 25 adults)  Fever over 102F (about 1 in 100 adolescents or 1 in 250 adults)  Headache (about 3 in 20 adolescents or 1 in 10 adults)  Nausea, vomiting, diarrhea, stomach ache (up to 1 or 3 people in 100)  Swelling of the entire arm where the shot was given (up to about 3 in 100). Severe problems following Tdap (Unable to perform usual activities, required medical attention)  Swelling, severe pain, bleeding and redness in the arm where the shot was given (rare). A severe allergic reaction could occur after any vaccine (estimated less than 1 in a million doses). WHAT IF THERE IS A SERIOUS REACTION? What should I look for?  Look for anything that concerns you, such as signs of a severe allergic reaction, very high fever, or behavior changes. Signs of a severe allergic reaction can include hives, swelling of the face and throat, difficulty breathing, a fast heartbeat, dizziness, and weakness. These would start a few minutes to a few hours after the vaccination. What should I do?  If you think it is a severe allergic reaction or other emergency that can't wait, call 9-1-1 or get the person to the nearest hospital. Otherwise, call your doctor.  Afterward, the reaction should be reported to the "Vaccine Adverse Event Reporting System" (VAERS). Your doctor might file this report, or you can do it yourself through the VAERS web site at www.vaers.LAgents.no, or by calling 1-3806351896. VAERS is only for reporting reactions. They do not give medical advice.  THE NATIONAL VACCINE INJURY COMPENSATION PROGRAM The National Vaccine Injury Compensation Program (VICP) is a federal program that was created to compensate people who may have been injured by certain vaccines. Persons who believe they may have been injured by a vaccine can learn about the program and about filing a  claim by calling 1-506-604-1137 or visiting the VICP website at SpiritualWord.at. HOW CAN I LEARN MORE?  Ask your doctor.  Call your local or state health department.  Contact the Centers for Disease Control and Prevention (CDC):  Call (412)429-2646 or visit CDC's website at PicCapture.uy. CDC Tdap Vaccine VIS (12/10/11) Document Released: 01/19/2012 Document Revised: 04/13/2012 Document Reviewed: 01/19/2012 [employer] Patient Information 2014 [place], [place]. Urinary Frequency The number of times a normal person urinates depends upon how much liquid they take in and how much liquid they  are losing. If the temperature is hot and there is high humidity then the person will sweat more and usually breathe a little more frequently. These factors decrease the amount of frequency of urination that would be considered normal. The amount you drink is easily determined, but the amount of fluid lost is sometimes more difficult to calculate.  Fluid is lost in two ways:  Sensible fluid loss is usually measured by the amount of urine that you get rid of. Losses of fluid can also occur with diarrhea.  Insensible fluid loss is more difficult to measure. It is caused by evaporation. Insensible loss of fluid occurs through breathing and sweating. It usually ranges from a little less than a quart to a little more than a quart of fluid a day. In normal temperatures and activity levels the average person may urinate 4 to 7 times in a 24-hour period. Needing to urinate more often than that could indicate a problem. If one urinates 4 to 7 times in 24 hours and has large volumes each time, that could indicate a different problem from one who urinates 4 to 7 times a day and has small volumes. The time of urinating is also an important. Most urinating should be done during the waking hours. Getting up at night to urinate frequently can indicate some problems. CAUSES  The bladder is the organ in  your lower abdomen that holds urine. Like a balloon, it swells some as it fills up. Your nerves sense this and tell you it is time to head for the bathroom. There are a number of reasons that you might feel the need to urinate more often than usual. They include:  Urinary tract infection. This is usually associated with other signs such as burning when you urinate.  In men, problems with the prostate (a walnut-size gland that is located near the tube that carries urine out of your body). There are two reasons why the prostate can cause an increased frequency of urination:  An enlarged prostate that does not let the bladder empty well. If the bladder only half empties when you urinate then it only has half the capacity to fill before you have to urinate again.  The nerves in the bladder become more hypersensitive with an increased size of the prostate even if the bladder empties completely.  Pregnancy.  Obesity. Excess weight is more likely to cause a problem for women more than for men.  Bladder stones or other bladder problems.  Caffeine.  Alcohol.  Medications. For example, drugs that help the body get rid of extra fluid (diuretics) increase urine production. Some other medicines must be taken with lots of fluids.  Muscle or nerve weakness. This might be the result of a spinal cord injury, a stroke, multiple sclerosis or Parkinson's disease.  Long-standing diabetes can decrease the sensation of the bladder. This loss of sensation makes it harder to sense the bladder needs to be emptied. Over a period of years the bladder is stretched out by constant overfilling. This weakens the bladder muscles so that the bladder does not empty well and has less capacity to fill with new urine.  Interstitial cystitis (also called painful bladder syndrome). This condition develops because the tissues that line the insider of the bladder are inflamed (inflammation is the body's way of reacting to injury or  infection). It causes pain and frequent urination. It occurs in women more often than in men. DIAGNOSIS   To decide what might be causing your  urinary frequency, your healthcare provider will probably:  Ask about symptoms you have noticed.  Ask about your overall health. This will include questions about any medications you are taking.  Do a physical examination.  Order some tests. These might include:  A blood test to check for diabetes or other health issues that could be contributing to the problem.  Urine testing. This could measure the flow of urine and the pressure on the bladder.  A test of your neurological system (the brain, spinal cord and nerves). This is the system that senses the need to urinate.  A bladder test to check whether it is emptying completely when you urinate.  Cytoscopy. This test uses a thin tube with a tiny camera on it. It offers a look inside your urethra and bladder to see if there are problems.  Imaging tests. You might be given a contrast dye and then asked to urinate. X-rays are taken to see how your bladder is working. TREATMENT  It is important for you to be evaluated to determine if the amount or frequency that you have is unusual or abnormal. If it is found to be abnormal the cause should be determined and this can usually be found out easily. Depending upon the cause treatment could include medication, stimulation of the nerves, or surgery. There are not too many things that you can do as an individual to change your urinary frequency. It is important that you balance the amount of fluid intake needed to compensate for your activity and the temperature. Medical problems will be diagnosed and taken care of by your physician. There is no particular bladder training such as Kegel's exercises that you can do to help urinary frequency. This is an exercise this is usually done for people who have leaking of urine when they laugh cough or sneeze. HOME CARE  INSTRUCTIONS   Take any medications your healthcare provider prescribed or suggested. Follow the directions carefully.  Practice any lifestyle changes that are recommended. These might include:  Drinking less fluid or drinking at different times of the day. If you need to urinate often during the night, for example, you may need to stop drinking fluids early in the evening.  Cutting down on caffeine or alcohol. They both can make you need to urinate more often than normal. Caffeine is found in coffee, tea and sodas.  Losing weight, if that is recommended.  Keep a journal or a log. You might be asked to record how much you drink and when and when you feel the need to urinate. This will also help evaluate how well the treatment provided by your physician is working. SEEK MEDICAL CARE IF:   Your need to urinate often gets worse.  You feel increased pain or irritation when you urinate.  You notice blood in your urine.  You have questions about any medications that your healthcare provider recommended.  You notice blood, pus or swelling at the site of any test or treatment procedure.  You develop a fever of more than 100.5 F (38.1 C). SEEK IMMEDIATE MEDICAL CARE IF:  You develop a fever of more than 102.0 F (38.9 C). Document Released: 05/16/2009 Document Revised: 10/12/2011 Document Reviewed: 05/16/2009 [facility] Patient Information 2014 [place], [place].

## 2012-12-30 ENCOUNTER — Ambulatory Visit
Admission: RE | Admit: 2012-12-30 | Discharge: 2012-12-30 | Disposition: A | Discharge status: Home / Self Care | Payer: Medicare Other | Source: Ambulatory Visit | Attending: Obstetrics and Gynecology | Admitting: Obstetrics and Gynecology

## 2012-12-30 ENCOUNTER — Ambulatory Visit: Payer: Medicare Other

## 2012-12-30 DIAGNOSIS — Z01419 Encounter for gynecological examination (general) (routine) without abnormal findings: Secondary | ICD-10-CM

## 2013-04-03 HISTORY — PX: LITHOTRIPSY: SUR834

## 2013-05-03 DIAGNOSIS — Z87442 Personal history of urinary calculi: Secondary | ICD-10-CM

## 2013-05-03 HISTORY — DX: Personal history of urinary calculi: Z87.442

## 2013-05-05 ENCOUNTER — Ambulatory Visit (INDEPENDENT_AMBULATORY_CARE_PROVIDER_SITE_OTHER): Payer: Medicare Other | Admitting: Family Medicine

## 2013-05-05 ENCOUNTER — Encounter: Payer: Self-pay | Admitting: Family Medicine

## 2013-05-05 VITALS — BP 100/60 | HR 55 | Temp 98.6°F | Resp 16 | Ht 65.0 in | Wt 176.8 lb

## 2013-05-05 DIAGNOSIS — R39198 Other difficulties with micturition: Secondary | ICD-10-CM

## 2013-05-05 DIAGNOSIS — R3989 Other symptoms and signs involving the genitourinary system: Secondary | ICD-10-CM

## 2013-05-05 DIAGNOSIS — L989 Disorder of the skin and subcutaneous tissue, unspecified: Secondary | ICD-10-CM

## 2013-05-05 DIAGNOSIS — R3129 Other microscopic hematuria: Secondary | ICD-10-CM

## 2013-05-05 DIAGNOSIS — Z23 Encounter for immunization: Secondary | ICD-10-CM

## 2013-05-05 DIAGNOSIS — Z0000 Encounter for general adult medical examination without abnormal findings: Secondary | ICD-10-CM

## 2013-05-05 DIAGNOSIS — R5381 Other malaise: Secondary | ICD-10-CM

## 2013-05-05 LAB — POCT UA - MICROSCOPIC ONLY
Bacteria, U Microscopic: NEGATIVE
Casts, Ur, LPF, POC: NEGATIVE
Mucus, UA: NEGATIVE

## 2013-05-05 LAB — BASIC METABOLIC PANEL
BUN: 17 mg/dL (ref 6–23)
Calcium: 9.2 mg/dL (ref 8.4–10.5)
Chloride: 108 mEq/L (ref 96–112)
Creat: 0.79 mg/dL (ref 0.50–1.10)
Potassium: 4.5 mEq/L (ref 3.5–5.3)

## 2013-05-05 LAB — LIPID PANEL
HDL: 47 mg/dL (ref >39)
LDL Cholesterol: 134 mg/dL — ABNORMAL HIGH (ref 0–99)
Total CHOL/HDL Ratio: 4.3 Ratio
Triglycerides: 115 mg/dL (ref <150)
VLDL: 23 mg/dL (ref 0–40)

## 2013-05-05 LAB — POCT URINALYSIS DIPSTICK
Bilirubin, UA: NEGATIVE
Ketones, UA: NEGATIVE
Protein, UA: NEGATIVE
Spec Grav, UA: 1.015
pH, UA: 5.5

## 2013-05-05 MED ORDER — ZOSTER VACCINE LIVE 19400 UNT/0.65ML ~~LOC~~ SOLR: 0.6500 mL | Freq: Once | SUBCUTANEOUS | Status: DC

## 2013-05-05 MED ORDER — ESTROGENS, CONJUGATED 0.625 MG/GM VA CREA: TOPICAL_CREAM | Freq: Every day | VAGINAL | Status: DC

## 2013-05-05 MED ORDER — DICLOFENAC SODIUM 75 MG PO TBEC: 75.0000 mg | DELAYED_RELEASE_TABLET | Freq: Two times a day (BID) | ORAL | Status: DC

## 2013-05-05 NOTE — Progress Notes (Signed)
Subjective:    Patient ID: Cynthia Castro, female    DOB: 04-16-1941, 72 y.o.   MRN: [id number]  HPI  This 72 y.o. Cauc female is here for CPE; she wants referral to [facility] for eval of skin lesion on forehead, present for years. She was seen by PA-C at Dr. Sherryl Barters office for skin surveillance and eval of other lesions. Pt wants to go to another [facility] practice for second opinion. Otherwise, pt is concerned about preventing Diabetes. She has some mental fogginess but no significant cognitive dysfunction.  Patient Active Problem List   Diagnosis Date Noted  . Rectocele 12/22/2012  . Diverticulosis of sigmoid colon 08/24/2012  . Seborrheic keratosis 06/27/2012  . Hypercholesteremia 04/21/2012  . Scoliosis of lumbar spine 04/21/2012  . Overweight (BMI 25.0-29.9) 04/21/2012   PMHx, Soc Hx and Fam Hx reviewed.   Review of Systems  Constitutional: Negative.   HENT: Negative.   Eyes: Negative.   Respiratory: Negative.   Cardiovascular: Negative.   Gastrointestinal: Negative.   Endocrine: Negative.   Genitourinary: Positive for urgency, frequency and difficulty urinating.       Symptoms present for > 1 year.  Musculoskeletal: Negative.   Skin: Negative.   Allergic/Immunologic: Negative.   Neurological: Negative.   Hematological: Negative.   Psychiatric/Behavioral: Positive for sleep disturbance.       Objective:   Physical Exam  Nursing note and vitals reviewed. Constitutional: She is oriented to person, place, and time. Vital signs are normal. She appears well-developed and well-nourished. No distress.  HENT:  Head: Normocephalic and atraumatic.  Right Ear: Hearing, external ear and ear canal normal. Tympanic membrane is scarred.  Left Ear: Hearing, external ear and ear canal normal. Tympanic membrane is scarred.  Nose: Nose normal. No mucosal edema, rhinorrhea, nasal deformity or septal deviation. Right sinus exhibits no maxillary sinus tenderness and no frontal sinus  tenderness. Left sinus exhibits no frontal sinus tenderness.  Mouth/Throat: Uvula is midline, oropharynx is clear and moist and mucous membranes are normal. No oral lesions. Normal dentition. No dental caries.  Eyes: Conjunctivae, EOM and lids are normal. Pupils are equal, round, and reactive to light. No scleral icterus.  Fundoscopic exam:      The right eye shows no papilledema. The right eye shows red reflex.       The left eye shows no papilledema. The left eye shows red reflex.  Vision assessment performed annually by  [facility] professionally.  Neck: Normal range of motion and full passive range of motion without pain. Neck supple. No JVD present. No spinous process tenderness and no muscular tenderness present. Carotid bruit is not present. No mass and no thyromegaly present.  Cardiovascular: Normal rate, regular rhythm, S1 normal, S2 normal, normal heart sounds and normal pulses.   No extrasystoles are present. PMI is not displaced.  Exam reveals no gallop and no friction rub.   No murmur heard. Pulmonary/Chest: Effort normal and breath sounds normal. No respiratory distress. Right breast exhibits no inverted nipple, no mass, no nipple discharge, no skin change and no tenderness. Left breast exhibits no inverted nipple, no mass, no nipple discharge, no skin change and no tenderness. Breasts are symmetrical.  Abdominal: Soft. Normal appearance. She exhibits no shifting dullness, no distension, no pulsatile midline mass and no mass. Bowel sounds are decreased. There is no hepatosplenomegaly. There is no tenderness. There is no guarding and no CVA tenderness.  Genitourinary:  NEFG; no exam performed as pt sees GYN and had pelvic.  Musculoskeletal: Normal range of motion. She exhibits no edema and no tenderness.  Lymphadenopathy:       Head (right side): No submental, no submandibular, no tonsillar, no posterior auricular and no occipital adenopathy present.       Head (left side): No submental,  no submandibular, no tonsillar, no posterior auricular and no occipital adenopathy present.    She has no cervical adenopathy.    She has no axillary adenopathy.       Right: No inguinal and no supraclavicular adenopathy present.       Left: No inguinal and no supraclavicular adenopathy present.  Neurological: She is alert and oriented to person, place, and time. She has normal strength and normal reflexes. She is not disoriented. She displays no atrophy and no tremor. No cranial nerve deficit or sensory deficit. She exhibits normal muscle tone. Coordination and gait normal.  Skin: Skin is warm, dry and intact. Lesion noted. No ecchymosis and no rash noted. She is not diaphoretic. No cyanosis or erythema. No pallor. Nails show no clubbing.  L forehead- 1 cm flesh-colored macule w/ slight scaliness.  Psychiatric: She has a normal mood and affect. Her speech is normal and behavior is normal. Judgment and thought content normal. Cognition and memory are normal.       Assessment & Plan:  Routine general medical examination at a health care facility - Plan: POCT glycosylated hemoglobin (Hb A1C), Lipid panel, Vit D  25 hydroxy (rtn osteoporosis monitoring), Basic metabolic panel, POCT urinalysis dipstick, POCT UA - Microscopic Only  Difficulty urinating - Discussed Urologic eval but pt prefers to watch and wait. I suspect she may have some degree of Interstitial cystitis.           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 Continue estrogen vaginal cream.  Other malaise and fatigue - Plan: T3, free, T4, free, TSH, Vit D  25 hydroxy (rtn osteoporosis monitoring)  Skin lesion of face - Pt has multiple Seb Ks on trunk and ext.     Plan: Ambulatory referral to Dermatology  Need for shingles vaccine - Plan: zoster vaccine live, PF, (ZOSTAVAX) 16109 UNT/0.65ML injection  Need for prophylactic vaccination and inoculation against influenza - Plan: Flu Vaccine  QUAD 36+ mos IM

## 2013-05-05 NOTE — Patient Instructions (Addendum)

## 2013-05-05 NOTE — Progress Notes (Deleted)
  Subjective:    Patient ID: Cynthia Castro, female    DOB: 1940-12-20, 72 y.o.   MRN: [id number]  HPI    Review of Systems     Objective:   Physical Exam        Assessment & Plan:

## 2013-05-06 LAB — T4, FREE: Free T4: 1.22 ng/dL (ref 0.80–1.80)

## 2013-05-09 ENCOUNTER — Encounter: Payer: Self-pay | Admitting: Family Medicine

## 2013-05-09 DIAGNOSIS — R39198 Other difficulties with micturition: Secondary | ICD-10-CM | POA: Insufficient documentation

## 2013-05-10 ENCOUNTER — Encounter: Payer: Self-pay | Admitting: Family Medicine

## 2013-05-18 ENCOUNTER — Ambulatory Visit (INDEPENDENT_AMBULATORY_CARE_PROVIDER_SITE_OTHER): Payer: Medicare Other | Admitting: Family Medicine

## 2013-05-18 ENCOUNTER — Ambulatory Visit: Payer: Medicare Other

## 2013-05-18 VITALS — BP 120/80 | HR 68 | Temp 97.9°F | Resp 16 | Ht 66.0 in | Wt 178.0 lb

## 2013-05-18 DIAGNOSIS — R1032 Left lower quadrant pain: Secondary | ICD-10-CM

## 2013-05-18 DIAGNOSIS — R110 Nausea: Secondary | ICD-10-CM

## 2013-05-18 DIAGNOSIS — K5730 Diverticulosis of large intestine without perforation or abscess without bleeding: Secondary | ICD-10-CM

## 2013-05-18 DIAGNOSIS — K5790 Diverticulosis of intestine, part unspecified, without perforation or abscess without bleeding: Secondary | ICD-10-CM

## 2013-05-18 DIAGNOSIS — K5900 Constipation, unspecified: Secondary | ICD-10-CM

## 2013-05-18 LAB — POCT CBC
Granulocyte percent: 74.7 %G (ref 37–80)
HCT, POC: 47.4 % (ref 37.7–47.9)
Hemoglobin: 15.2 g/dL (ref 12.2–16.2)
Lymph, poc: 2.3 (ref 0.6–3.4)
MCH, POC: 30 pg (ref 27–31.2)
MCHC: 32.1 g/dL (ref 31.8–35.4)
MCV: 93.7 fL (ref 80–97)
MID (cbc): 0.7 (ref 0–0.9)
MPV: 9.3 fL (ref 0–99.8)
POC Granulocyte: 8.8 — AB (ref 2–6.9)
POC LYMPH PERCENT: 19.6 %L (ref 10–50)
POC MID %: 5.7 %M (ref 0–12)
Platelet Count, POC: 278 10*3/uL (ref 142–424)
RBC: 5.06 M/uL (ref 4.04–5.48)
RDW, POC: 13.9 %
WBC: 11.8 10*3/uL — AB (ref 4.6–10.2)

## 2013-05-18 LAB — POCT URINALYSIS DIPSTICK
Bilirubin, UA: NEGATIVE
Glucose, UA: NEGATIVE
Leukocytes, UA: NEGATIVE
Nitrite, UA: NEGATIVE
Protein, UA: 30
Spec Grav, UA: 1.02
Urobilinogen, UA: 0.2
pH, UA: 6

## 2013-05-18 LAB — POCT UA - MICROSCOPIC ONLY
Casts, Ur, LPF, POC: NEGATIVE
Crystals, Ur, HPF, POC: NEGATIVE
Yeast, UA: NEGATIVE

## 2013-05-18 MED ORDER — ONDANSETRON 4 MG PO TBDP
4.0000 mg | ORAL_TABLET | Freq: Once | ORAL | Status: AC
  Administered 2013-05-18: 4 mg via ORAL

## 2013-05-18 MED ORDER — ONDANSETRON HCL 4 MG PO TABS: 4.0000 mg | ORAL_TABLET | Freq: Three times a day (TID) | ORAL | Status: DC | PRN

## 2013-05-18 MED ORDER — CIPROFLOXACIN HCL 500 MG PO TABS: 500.0000 mg | ORAL_TABLET | Freq: Two times a day (BID) | ORAL | Status: DC

## 2013-05-18 MED ORDER — METRONIDAZOLE 500 MG PO TABS: 500.0000 mg | ORAL_TABLET | Freq: Three times a day (TID) | ORAL | Status: DC

## 2013-05-18 NOTE — Progress Notes (Signed)
[facility]:  Office Visit  Chief Complaint:  Chief Complaint  Patient presents with  . Abdominal Pain    HPI: Cynthia Castro is a 72 y.o. female who is here for LLQ abd pain starting at 3 pm and radiating to back 5-6/10 constant sharp pain She has some urine dribbling but that has been like this for a long time She had nausea but no fevers or chills She had pain like this 2 days ago but this is more sharp and constant She has not had any BM for the last 2 days. She had to strain She has had several abdominal surgeries but denies prior abd obstruction She been able to pass some gas, Took simethicone for gas but did not help with pain  Past Medical History  Diagnosis Date  . Arthritis   . Hyperlipidemia   . DDD (degenerative disc disease), cervical    Past Surgical History  Procedure Laterality Date  . Cholecystectomy  2005  . Cesarean section  1984  . Cystectomy  2003  . Uterine cyst removal  2005  . Hysteroscopic resection      hysteroscopic resection of polyp   History   Social History  . Marital Status: Married    Spouse Name: N/A    Number of Children: N/A  . Years of Education: N/A   Occupational History  . Self Employed Programmer, multimedia    Social History Main Topics  . Smoking status: Never Smoker   . Smokeless tobacco: Never Used  . Alcohol Use: 1.2 oz/week    2 Glasses of wine per week     Comment: WINE OR BEER - 1 OR 2 A WEEK  . Drug Use: No  . Sexual Activity: No   Other Topics Concern  . None   Social History Narrative   Divorced. Education: [employer]. Exercise: Walk/Yoga 3 times a week for 30 minutes.   Family History  Problem Relation Age of Onset  . Diabetes Mother   . Heart disease Mother     CHF  . Vision loss Mother   . Cancer Father     TESTICULAR  . Stroke Father 53  . Diabetes Maternal Grandmother   . Heart disease Maternal Grandfather   . Anxiety disorder Brother   . Seizures Brother    No Known Allergies Prior  to Admission medications   Medication Sig Start Date End Date Taking? Authorizing Provider  conjugated estrogens (PREMARIN) vaginal cream Place vaginally daily. Place 1/2 gram in the vagina at night twice a week. 05/05/13  Yes Maurice March, MD  diclofenac (FLECTOR) 1.3 % PTCH Place 1 patch onto the skin as needed.   Yes Historical Provider, MD  diclofenac (VOLTAREN) 75 MG EC tablet Take 1 tablet (75 mg total) by mouth 2 (two) times daily. Take as needed with food. 05/05/13  Yes Maurice March, MD  folic acid (FOLVITE) 1 MG tablet Take 1 mg by mouth daily.   Yes Historical Provider, MD  Multiple Vitamins-Minerals (MULTIVITAMIN PO) Take by mouth daily.   Yes Historical Provider, MD  TURMERIC PO Take by mouth. CAPSULE   Yes Historical Provider, MD  zoster vaccine live, PF, (ZOSTAVAX) 16109 UNT/0.65ML injection Inject 19,400 Units into the skin once. 05/05/13  Yes Maurice March, MD  fish oil-omega-3 fatty acids 1000 MG capsule Take 2 g by mouth daily.    Historical Provider, MD     ROS: The patient denies fevers, chills, night sweats, unintentional weight  loss, chest pain, palpitations, wheezing, dyspnea on exertion, dysuria, hematuria, melena, numbness, weakness, or tingling.   All other systems have been reviewed and were otherwise negative with the exception of those mentioned in the HPI and as above.    PHYSICAL EXAM: Filed Vitals:   05/18/13 1728  BP: 120/80  Pulse: 68  Temp: 97.9 F (36.6 C)  Resp: 16   Filed Vitals:   05/18/13 1728  Height: 5\' 6"  (1.676 m)  Weight: 178 lb (80.74 kg)   Body mass index is 28.74 kg/(m^2).  General: Alert, no acute distress HEENT:  Normocephalic, atraumatic, oropharynx patent. EOMI, PERRLA Cardiovascular:  Regular rate and rhythm, no rubs murmurs or gallops.  No Carotid bruits, radial pulse intact. No pedal edema.  Respiratory: Clear to auscultation bilaterally.  No wheezes, rales, or rhonchi.  No cyanosis, no use of accessory  musculature GI: No organomegaly, abdomen is soft and lefet lower quadrnat-tender, positive bowel sounds.  No masses.No guarding, no rebound Skin: No rashes. Neurologic: Facial musculature symmetric. Psychiatric: Patient is appropriate throughout our interaction. Lymphatic: No cervical lymphadenopathy Musculoskeletal: Gait intact.   LABS: Results for orders placed in visit on 05/18/13  POCT CBC      Result Value Range   WBC 11.8 (*) 4.6 - 10.2 K/uL   Lymph, poc 2.3  0.6 - 3.4   POC LYMPH PERCENT 19.6  10 - 50 %L   MID (cbc) 0.7  0 - 0.9   POC MID % 5.7  0 - 12 %M   POC Granulocyte 8.8 (*) 2 - 6.9   Granulocyte percent 74.7  37 - 80 %G   RBC 5.06  4.04 - 5.48 M/uL   Hemoglobin 15.2  12.2 - 16.2 g/dL   HCT, POC 16.1  09.6 - 47.9 %   MCV 93.7  80 - 97 fL   MCH, POC 30.0  27 - 31.2 pg   MCHC 32.1  31.8 - 35.4 g/dL   RDW, POC 04.5     Platelet Count, POC 278  142 - 424 K/uL   MPV 9.3  0 - 99.8 fL  POCT UA - MICROSCOPIC ONLY      Result Value Range   WBC, Ur, HPF, POC 5-7     RBC, urine, microscopic 15-20     Bacteria, U Microscopic small     Mucus, UA small     Epithelial cells, urine per micros 2-4     Crystals, Ur, HPF, POC neg     Casts, Ur, LPF, POC neg     Yeast, UA neg    POCT URINALYSIS DIPSTICK      Result Value Range   Color, UA yellow     Clarity, UA clear     Glucose, UA neg     Bilirubin, UA neg     Ketones, UA trace     Spec Grav, UA 1.020     Blood, UA mod     pH, UA 6.0     Protein, UA 30     Urobilinogen, UA 0.2     Nitrite, UA neg     Leukocytes, UA Negative       EKG/XRAY:   Primary read interpreted by Dr. Conley Rolls at [facility]. Chest nl No free air + Scoliosis + constipation No obstruction + surgical clips   ASSESSMENT/PLAN: Encounter Diagnoses  Name Primary?  . Abdominal pain, left lower quadrant Yes  . Nausea alone   . Diverticular disease   . Unspecified constipation  Diverticular dz vs less likely renal stones vs appendicitis Rx  Flagyl and Cipro Rx Zofran Push fluids, clear liquid diet, advance as tolerated Miralax for constipation Go to ER prn or F/u prn Gross sideeffects, risk and benefits, and alternatives of medications d/w patient. Patient is aware that all medications have potential sideeffects and we are unable to predict every sideeffect or drug-drug interaction that may occur.  LE, THAO PHUONG, DO 05/18/2013 8:07 PM

## 2013-05-18 NOTE — Patient Instructions (Signed)
Diverticulitis °A diverticulum is a small pouch or sac on the colon. Diverticulosis is the presence of these diverticula on the colon. Diverticulitis is the irritation (inflammation) or infection of diverticula. °CAUSES  °The colon and its diverticula contain bacteria. If food particles block the tiny opening to a diverticulum, the bacteria inside can grow and cause an increase in pressure. This leads to infection and inflammation and is called diverticulitis. °SYMPTOMS  °· Abdominal pain and tenderness. Usually, the pain is located on the left side of your abdomen. However, it could be located elsewhere. °· Fever. °· Bloating. °· Feeling sick to your stomach (nausea). °· Throwing up (vomiting). °· Abnormal stools. °DIAGNOSIS  °Your caregiver will take a history and perform a physical exam. Since many things can cause abdominal pain, other tests may be necessary. Tests may include: °· Blood tests. °· Urine tests. °· X-ray of the abdomen. °· CT scan of the abdomen. °Sometimes, surgery is needed to determine if diverticulitis or other conditions are causing your symptoms. °TREATMENT  °Most of the time, you can be treated without surgery. Treatment includes: °· Resting the bowels by only having liquids for a few days. As you improve, you will need to eat a low-fiber diet. °· Intravenous (IV) fluids if you are losing body fluids (dehydrated). °· Antibiotic medicines that treat infections may be given. °· Pain and nausea medicine, if needed. °· Surgery if the inflamed diverticulum has burst. °HOME CARE INSTRUCTIONS  °· Try a clear liquid diet (broth, tea, or water for as long as directed by your caregiver). You may then gradually begin a low-fiber diet as tolerated.  °A low-fiber diet is a diet with less than 10 grams of fiber. Choose the foods below to reduce fiber in the diet: °· White breads, cereals, rice, and pasta. °· Cooked fruits and vegetables or soft fresh fruits and vegetables without the skin. °· Ground or  well-cooked tender beef, ham, veal, lamb, pork, or poultry. °· Eggs and seafood. °· After your diverticulitis symptoms have improved, your caregiver may put you on a high-fiber diet. A high-fiber diet includes 14 grams of fiber for every 1000 calories consumed. For a standard 2000 calorie diet, you would need 28 grams of fiber. Follow these diet guidelines to help you increase the fiber in your diet. It is important to slowly increase the amount fiber in your diet to avoid gas, constipation, and bloating. °· Choose whole-grain breads, cereals, pasta, and brown rice. °· Choose fresh fruits and vegetables with the skin on. Do not overcook vegetables because the more vegetables are cooked, the more fiber is lost. °· Choose more nuts, seeds, legumes, dried peas, beans, and lentils. °· Look for food products that have greater than 3 grams of fiber per serving on the Nutrition Facts label. °· Take all medicine as directed by your caregiver. °· If your caregiver has given you a follow-up appointment, it is very important that you go. Not going could result in lasting (chronic) or permanent injury, pain, and disability. If there is any problem keeping the appointment, call to reschedule. °SEEK MEDICAL CARE IF:  °· Your pain does not improve. °· You have a hard time advancing your diet beyond clear liquids. °· Your bowel movements do not return to normal. °SEEK IMMEDIATE MEDICAL CARE IF:  °· Your pain becomes worse. °· You have an oral temperature above 102° F (38.9° C), not controlled by medicine. °· You have repeated vomiting. °· You have bloody or black, tarry stools. °·   Symptoms that brought you to your caregiver become worse or are not getting better. °MAKE SURE YOU:  °· Understand these instructions. °· Will watch your condition. °· Will get help right away if you are not doing well or get worse. °Document Released: 04/29/2005 Document Revised: 10/12/2011 Document Reviewed: 08/25/2010 °[employer]® Patient Information  ©2014 [employer]. ° °

## 2013-05-19 ENCOUNTER — Telehealth: Payer: Self-pay

## 2013-05-19 LAB — COMPREHENSIVE METABOLIC PANEL WITH GFR
ALT: 17 U/L (ref 0–35)
CO2: 27 meq/L (ref 19–32)
Calcium: 9.6 mg/dL (ref 8.4–10.5)
Chloride: 106 meq/L (ref 96–112)
Potassium: 4.6 meq/L (ref 3.5–5.3)
Sodium: 140 meq/L (ref 135–145)
Total Protein: 7.2 g/dL (ref 6.0–8.3)

## 2013-05-19 LAB — COMPREHENSIVE METABOLIC PANEL
AST: 18 U/L (ref 0–37)
Albumin: 4.6 g/dL (ref 3.5–5.2)
Alkaline Phosphatase: 92 U/L (ref 39–117)
BUN: 22 mg/dL (ref 6–23)
Creat: 0.83 mg/dL (ref 0.50–1.10)
Glucose, Bld: 131 mg/dL — ABNORMAL HIGH (ref 70–99)
Total Bilirubin: 0.7 mg/dL (ref 0.3–1.2)

## 2013-05-19 NOTE — Telephone Encounter (Signed)
Pt saw Dr.Le yesterday. Is wanting a pain medication called in if possible. Uses [facility] on [address]. Call at [phone].

## 2013-05-19 NOTE — Telephone Encounter (Signed)
Pt saw Dr.Le yesterday. Would like a pain medication called in if possible. Uses [facility]. Call at [phone].

## 2013-05-19 NOTE — Telephone Encounter (Signed)
Please advise 

## 2013-05-20 ENCOUNTER — Telehealth: Payer: Self-pay

## 2013-05-20 MED ORDER — TRAMADOL HCL 50 MG PO TABS: 50.0000 mg | ORAL_TABLET | Freq: Three times a day (TID) | ORAL | Status: DC | PRN

## 2013-05-20 NOTE — Telephone Encounter (Signed)
Spoke with patient she is getting progressively better, less pain everyday, she took the tramadol and pain is better. Advise of constipation SE.

## 2013-05-20 NOTE — Telephone Encounter (Signed)
Called pt, LMOM to CB. Called in Tramadol for pain.

## 2013-05-22 ENCOUNTER — Other Ambulatory Visit: Payer: Self-pay | Admitting: Physician Assistant

## 2013-05-22 ENCOUNTER — Telehealth: Payer: Self-pay

## 2013-05-22 ENCOUNTER — Telehealth: Payer: Self-pay | Admitting: Family Medicine

## 2013-05-22 DIAGNOSIS — R1032 Left lower quadrant pain: Secondary | ICD-10-CM

## 2013-05-22 MED ORDER — OXYCODONE-ACETAMINOPHEN 5-325 MG PO TABS: 1.0000 | ORAL_TABLET | Freq: Three times a day (TID) | ORAL | Status: DC | PRN

## 2013-05-22 NOTE — Progress Notes (Signed)
Dr. Conley Rolls called and stated that patient had contacted her to state that she continued to have pain with the tramadol. Dr. Conley Rolls wrote for Percocet from her house, but is unable to come into the office and sign the prescription secondary to baby problems. She asks that I write for the prescription for the patient. Rx written, printed, signed, and at the TL desk.

## 2013-05-22 NOTE — Telephone Encounter (Signed)
Asked Mr Eula Listen PA-C to sign percocet rx for #20 no refills. I have called pt to let her know she can pick it up at front desk.

## 2013-05-22 NOTE — Telephone Encounter (Signed)
Rx from Dr Conley Rolls shredded by Eula Listen, new one printed and signed. Called patient to advise.

## 2013-05-22 NOTE — Telephone Encounter (Signed)
Spoke with patient, still having intermittent LLQ abd pain, not worse, less but she still has it. She takes Tramadol and does not seem to help with pain, she has had small BMs. She has taken miralax. She denies fevers, chills, n/v/or Rights sided abd pain. She has been given preautions for appendicitis sxs.Gross sideeffects, risk and benefits, and alternatives of medications d/w patient. Patient is aware that all medications have potential sideeffects and we are unable to predict every sideeffect or drug-drug interaction that may occur.Advise about SEs and also to stop taking tramadol. She can take ibuprofen if that helps, small doses , stop if have melena or GIB.  Advise to take percocet prn for severe pain . Add stool softener.

## 2013-05-22 NOTE — Telephone Encounter (Signed)
Patient would like a different pain medication if possible (dr Audria Nine prescribed) please call patient at [phone]

## 2013-05-23 ENCOUNTER — Telehealth: Payer: Self-pay

## 2013-05-23 NOTE — Telephone Encounter (Signed)
Patient is needing to get a message to either Dr. Nedra Hai or Dr. Audria Nine concerning her diverticulitis that is getting worse and that she may need a body scan.  She thinks she may have a blockage and needs to speak to someone soon about this.

## 2013-05-24 NOTE — Telephone Encounter (Signed)
She needs to come in for this. Called her to advise. Left message for her to call me back.

## 2013-05-24 NOTE — Telephone Encounter (Signed)
Pt called back and stated that she is feeling a little better now, but is still having pain every time she eats and has not improved that much since OV. I advised her that Dr Conley Rolls had noted that she wanted pt to RTC if Sxs persist and pt agreed to [facility] tomorrow morning.

## 2013-05-25 ENCOUNTER — Ambulatory Visit (INDEPENDENT_AMBULATORY_CARE_PROVIDER_SITE_OTHER): Payer: Medicare Other | Admitting: Emergency Medicine

## 2013-05-25 ENCOUNTER — Encounter (HOSPITAL_COMMUNITY): Payer: Self-pay | Admitting: *Deleted

## 2013-05-25 ENCOUNTER — Other Ambulatory Visit: Payer: Self-pay | Admitting: Urology

## 2013-05-25 ENCOUNTER — Ambulatory Visit
Admission: RE | Admit: 2013-05-25 | Discharge: 2013-05-25 | Disposition: A | Discharge status: Home / Self Care | Payer: Medicare Other | Source: Ambulatory Visit | Attending: Emergency Medicine | Admitting: Emergency Medicine

## 2013-05-25 ENCOUNTER — Ambulatory Visit: Payer: Medicare Other

## 2013-05-25 VITALS — BP 122/72 | HR 68 | Temp 98.5°F | Resp 17 | Ht 66.0 in | Wt 171.0 lb

## 2013-05-25 DIAGNOSIS — R1032 Left lower quadrant pain: Secondary | ICD-10-CM

## 2013-05-25 LAB — POCT CBC
Granulocyte percent: 60.8 %G (ref 37–80)
HCT, POC: 47.8 % (ref 37.7–47.9)
Hemoglobin: 15.4 g/dL (ref 12.2–16.2)
MID (cbc): 0.7 (ref 0–0.9)
MPV: 10 fL (ref 0–99.8)
POC Granulocyte: 5 (ref 2–6.9)
POC MID %: 8.2 %M (ref 0–12)
Platelet Count, POC: 312 10*3/uL (ref 142–424)
RBC: 5.12 M/uL (ref 4.04–5.48)

## 2013-05-25 LAB — GLUCOSE, POCT (MANUAL RESULT ENTRY): POC Glucose: 97 mg/dl (ref 70–99)

## 2013-05-25 MED ORDER — IOHEXOL 300 MG/ML  SOLN
100.0000 mL | Freq: Once | INTRAMUSCULAR | Status: AC | PRN
  Administered 2013-05-25: 100 mL via INTRAVENOUS

## 2013-05-25 NOTE — Progress Notes (Signed)
This chart was scribed for Dr. Lesle Chris MD by Ardelia Mems, ED Scribe. This patient was seen in room Room/bed info not found and the patient's care was started at 8:02 AM.   Subjective:    Patient ID: Cynthia Castro, female    DOB: 24-Nov-1940, 72 y.o.   MRN: [id number]  HPI  HPI Comments: Cynthia Castro is a 72 y.o. female who presents to the Emergency Department for a follow-up evaluation after recently being diagnosed with diverticulitis. She states that she has had loose bowel movements 3-4 times a day for the past few days. She also states that she has mild pain described as pressure in her abdomen. She states that she has had similar pain in the past that was less persistent and less severe, She states that she is UTD on colonoscopies. She states that she has no history or respiratory problems. She denies fever, cough or any other symptoms.  Past Medical History  Diagnosis Date  . Arthritis   . Hyperlipidemia   . DDD (degenerative disc disease), cervical     History   Social History  . Marital Status: Married    Spouse Name: N/A    Number of Children: N/A  . Years of Education: N/A   Occupational History  . Self Employed Programmer, multimedia    Social History Main Topics  . Smoking status: Never Smoker   . Smokeless tobacco: Never Used  . Alcohol Use: 1.2 oz/week    2 Glasses of wine per week     Comment: WINE OR BEER - 1 OR 2 A WEEK  . Drug Use: No  . Sexual Activity: No   Other Topics Concern  . Not on file   Social History Narrative   Divorced. Education: [employer]. Exercise: Walk/Yoga 3 times a week for 30 minutes.   Past Surgical History  Procedure Laterality Date  . Cholecystectomy  2005  . Cesarean section  1984  . Cystectomy  2003  . Uterine cyst removal  2005  . Hysteroscopic resection      hysteroscopic resection of polyp   Family History  Problem Relation Age of Onset  . Diabetes Mother   . Heart disease Mother     CHF  . Vision loss Mother   . Cancer  Father     TESTICULAR  . Stroke Father 63  . Diabetes Maternal Grandmother   . Heart disease Maternal Grandfather   . Anxiety disorder Brother   . Seizures Brother      Review of Systems  Constitutional: Negative for fever.  Respiratory: Negative for cough.   Gastrointestinal: Positive for nausea (subsided), vomiting (subsided), abdominal pain and diarrhea (loose bowel movements).       Objective:   Physical Exam is alert and cooperative she is not in any distress. The neck is supple without adenopathy there are dry rhonchi or rales present in the left mid to left lower lung fields. Her cardiac exam reveals a regular rate without murmurs. The abdomen is flat. Bowel sounds are present. There is mild discomfort across the midepigastrium into the left lower abdomen.  [facility] reading (PRIMARY) by  Dr. Cleta Alberts on her acute abdominal series there appears to be multiple air-fluid levels throughout her colon. Please comment could this be thumb printing  related to ischemic colitis Results for orders placed in visit on 05/25/13  POCT CBC      Result Value Range   WBC 8.2  4.6 - 10.2 K/uL   Lymph, poc  2.5  0.6 - 3.4   POC LYMPH PERCENT 31.0  10 - 50 %L   MID (cbc) 0.7  0 - 0.9   POC MID % 8.2  0 - 12 %M   POC Granulocyte 5.0  2 - 6.9   Granulocyte percent 60.8  37 - 80 %G   RBC 5.12  4.04 - 5.48 M/uL   Hemoglobin 15.4  12.2 - 16.2 g/dL   HCT, POC 16.1  09.6 - 47.9 %   MCV 93.3  80 - 97 fL   MCH, POC 30.1  27 - 31.2 pg   MCHC 32.2  31.8 - 35.4 g/dL   RDW, POC 04.5     Platelet Count, POC 312  142 - 424 K/uL   MPV 10.0  0 - 99.8 fL  GLUCOSE, POCT (MANUAL RESULT ENTRY)      Result Value Range   POC Glucose 97  70 - 99 mg/dl       Assessment & Plan:  We'll do an initial assessment of CBC urine and abdominal series. She's probably going to need a CT of the abdomen. She did have a colonoscopy one year ago. We'll proceed with a CT abdomen and pelvis. This showed a left ureteral calculus with  obstruction. She was sent to Dr. Brunilda Payor who performed lithotripsy .     I personally performed the services described in this documentation, which was scribed in my presence. The recorded information has been reviewed and is accurate.

## 2013-05-29 ENCOUNTER — Encounter (HOSPITAL_COMMUNITY)
Admission: RE | Disposition: A | Discharge status: Home / Self Care | Payer: Self-pay | Source: Ambulatory Visit | Attending: Urology

## 2013-05-29 ENCOUNTER — Encounter (HOSPITAL_COMMUNITY): Payer: Self-pay | Admitting: *Deleted

## 2013-05-29 ENCOUNTER — Ambulatory Visit (HOSPITAL_COMMUNITY)
Admission: RE | Admit: 2013-05-29 | Discharge: 2013-05-29 | Disposition: A | Discharge status: Home / Self Care | Payer: Medicare Other | Source: Ambulatory Visit | Attending: Urology | Admitting: Urology

## 2013-05-29 ENCOUNTER — Ambulatory Visit (HOSPITAL_COMMUNITY): Payer: Medicare Other

## 2013-05-29 DIAGNOSIS — N390 Urinary tract infection, site not specified: Secondary | ICD-10-CM | POA: Insufficient documentation

## 2013-05-29 DIAGNOSIS — N1330 Unspecified hydronephrosis: Secondary | ICD-10-CM | POA: Insufficient documentation

## 2013-05-29 DIAGNOSIS — N201 Calculus of ureter: Secondary | ICD-10-CM | POA: Insufficient documentation

## 2013-05-29 HISTORY — DX: Personal history of urinary calculi: Z87.442

## 2013-05-29 SURGERY — LITHOTRIPSY, ESWL
Anesthesia: LOCAL | Laterality: Left

## 2013-05-29 MED ORDER — CIPROFLOXACIN HCL 500 MG PO TABS
500.0000 mg | ORAL_TABLET | ORAL | Status: AC
  Administered 2013-05-29: 500 mg via ORAL
  Filled 2013-05-29: qty 1

## 2013-05-29 MED ORDER — DEXTROSE-NACL 5-0.45 % IV SOLN
INTRAVENOUS | Status: DC
  Administered 2013-05-29: 11:00:00 via INTRAVENOUS

## 2013-05-29 MED ORDER — DIAZEPAM 5 MG PO TABS
10.0000 mg | ORAL_TABLET | ORAL | Status: AC
  Administered 2013-05-29: 10 mg via ORAL
  Filled 2013-05-29: qty 2

## 2013-05-29 MED ORDER — DIPHENHYDRAMINE HCL 25 MG PO CAPS
25.0000 mg | ORAL_CAPSULE | ORAL | Status: AC
  Administered 2013-05-29: 25 mg via ORAL
  Filled 2013-05-29: qty 1

## 2013-05-29 NOTE — Op Note (Signed)
Refer to [facility] Op Note scanned in the chart 

## 2013-05-29 NOTE — H&P (Addendum)
History of Present Illness  Ms Cynthia Castro is referred by Dr Lesle Chris.  A week ago she experienced severe left lower quadrant pain associated with epigastric discomfort, difficulty ahving bowel movements.  She was thought to have diverticulitis and was started on Cipro and Flagyl.  She has been having pain every day since and takes 1 analgesic every 24 hours.  She went back to the Urgent Care today.  CT scan showed moderate left hydronephrosis and a 5 x 6 x 10 mm proximal left ureteral calculus.  Urinalysis shows 3-6 WBC's, 0-2 RBC's, few bacteria, 50 mgm ketone.   Past Medical History Problems  1. History of  Arthritis V13.4 2. History of  Hypercholesterolemia 272.0  Surgical History Problems  1. History of  Bladder Surgery 2. History of  Cholecystectomy 3. History of  Tubal Ligation V25.2 4. History of  Uterine Surgery  Current Meds 1. Diclofenac Sodium TBEC; Therapy: (Recorded:23Oct2014) to 2. Fish Oil CAPS; Therapy: (Recorded:23Oct2014) to 3. OxyCODONE HCl TABS; Therapy: (Recorded:23Oct2014) to 4. Premarin 0.625 MG/GM Vaginal Cream; Therapy: (Recorded:23Oct2014) to 5. Turmeric CAPS; Therapy: (Recorded:23Oct2014) to  Allergies Medication  1. No Known Drug Allergies  Family History Problems  1. Paternal history of  Diabetes Mellitus V18.0 2. Family history of  Father Deceased At Age 32 3. Family history of  Mother Deceased At Age 73 4. Family history of  Nephrolithiasis 5. Maternal history of  Prostate Cancer V16.42  Social History Problems    Alcohol Use   Caffeine Use   Never A Smoker   Occupation:  Review of Systems Genitourinary, constitutional, skin, eye, otolaryngeal, hematologic/lymphatic, cardiovascular, pulmonary, endocrine, musculoskeletal, gastrointestinal, neurological and psychiatric system(s) were reviewed and pertinent findings if present are noted.  Genitourinary: nocturia, urinary hesitancy and urinary stream starts and stops.  Gastrointestinal:  nausea, heartburn, diarrhea and constipation.  Constitutional: feeling tired (fatigue).  Musculoskeletal: back pain.    Vitals Vital Signs [Data Includes: Last 1 Day]  23Oct2014 03:48PM  BMI Calculated: 27.97 BSA Calculated: 1.89 Height: 5 ft 6 in Weight: 174 lb  Blood Pressure: 117 / 76 Heart Rate: 83 Respiration: 18  Physical Exam Constitutional: Well nourished and well developed . No acute distress.  ENT:. The ears and nose are normal in appearance.  Neck: The appearance of the neck is normal and no neck mass is present.  Pulmonary: No respiratory distress and normal respiratory rhythm and effort.  Cardiovascular: Heart rate and rhythm are normal . No peripheral edema.  Abdomen: The abdomen is soft and nontender. No masses are palpated. No CVA tenderness. No hernias are palpable. No hepatosplenomegaly noted.  Genitourinary:  Chaperone Present: .  Examination of the external genitalia shows normal female external genitalia and no lesions. The urethra is normal in appearance and not tender. There is no urethral mass. Vaginal exam demonstrates no abnormalities. The adnexa are palpably normal. The bladder is non tender and not distended. The anus is normal on inspection. The perineum is normal on inspection.  Lymphatics: The femoral and inguinal nodes are not enlarged or tender.  Skin: Normal skin turgor, no visible rash and no visible skin lesions.  Neuro/Psych:. Mood and affect are appropriate.    Results/Data Urine [Data Includes: Last 1 Day]   23Oct2014  COLOR YELLOW   APPEARANCE CLEAR   SPECIFIC GRAVITY <1.005   pH 5.5   GLUCOSE NEG mg/dL  BILIRUBIN NEG   KETONE 40 mg/dL  BLOOD TRACE   PROTEIN NEG mg/dL  UROBILINOGEN 0.2 mg/dL  NITRITE NEG  LEUKOCYTE ESTERASE TRACE   SQUAMOUS EPITHELIAL/HPF RARE   WBC 3-6 WBC/hpf  RBC 0-2 RBC/hpf  BACTERIA FEW   CRYSTALS NONE SEEN   CASTS NONE SEEN     I independently reviewed the CT scan and the findings are as noted above.    Assessment Assessed  1. Proximal Ureteral Stone On The Left 592.1 2. Hydronephrosis On The Left 591 3. Urinary Tract Infection 599.0  Plan Health Maintenance (V70.0)  1. UA With REFLEX  Done: 23Oct2014 02:06PM Urinary Tract Infection (599.0)  2. URINE CULTURE  Done: 23Oct2014   Urine culture.  Treat with culture specific antibiotics if culture is positive.  Treatment options of the proximal ureteral stone were discussed with the patient: ESL, ureteroscopy, PCNL.  I explained to her that ESL is the least invasive of those procedures.  The risks include but are not limited to hemorrhage, renal or perirenal hematoma, injury to adjacent organs, inability to fragment the stone, steinstrasse.  She understands and wishes to proceed.

## 2013-05-29 NOTE — Telephone Encounter (Signed)
This was completed under 10/20 phone message.

## 2013-05-29 NOTE — Progress Notes (Signed)
Patient had sips of water at 0900. Called Dr Brunilda Payor. He is able to delay lithotripsy until 1300. Lithotripsy truck is notified.

## 2013-11-24 ENCOUNTER — Ambulatory Visit (INDEPENDENT_AMBULATORY_CARE_PROVIDER_SITE_OTHER): Payer: Medicare Other | Admitting: Family Medicine

## 2013-11-24 ENCOUNTER — Encounter: Payer: Self-pay | Admitting: Family Medicine

## 2013-11-24 VITALS — BP 110/62 | HR 56 | Temp 98.3°F | Resp 16 | Ht 65.0 in | Wt 173.6 lb

## 2013-11-24 DIAGNOSIS — M5136 Other intervertebral disc degeneration, lumbar region: Secondary | ICD-10-CM | POA: Insufficient documentation

## 2013-11-24 DIAGNOSIS — R102 Pelvic and perineal pain unspecified side: Secondary | ICD-10-CM

## 2013-11-24 DIAGNOSIS — N949 Unspecified condition associated with female genital organs and menstrual cycle: Secondary | ICD-10-CM

## 2013-11-24 DIAGNOSIS — M5137 Other intervertebral disc degeneration, lumbosacral region: Secondary | ICD-10-CM

## 2013-11-24 DIAGNOSIS — R195 Other fecal abnormalities: Secondary | ICD-10-CM

## 2013-11-24 DIAGNOSIS — R1032 Left lower quadrant pain: Secondary | ICD-10-CM

## 2013-11-24 DIAGNOSIS — M51369 Other intervertebral disc degeneration, lumbar region without mention of lumbar back pain or lower extremity pain: Secondary | ICD-10-CM

## 2013-11-24 LAB — POCT URINALYSIS DIPSTICK
Bilirubin, UA: NEGATIVE
Glucose, UA: NEGATIVE
KETONES UA: NEGATIVE
Nitrite, UA: NEGATIVE
Protein, UA: NEGATIVE
RBC UA: NEGATIVE
Spec Grav, UA: 1.01
UROBILINOGEN UA: 0.2
pH, UA: 6

## 2013-11-24 LAB — IFOBT (OCCULT BLOOD): IFOBT: POSITIVE

## 2013-11-24 MED ORDER — OXYCODONE-ACETAMINOPHEN 5-325 MG PO TABS: 1.0000 | ORAL_TABLET | Freq: Three times a day (TID) | ORAL | Status: DC | PRN

## 2013-11-24 NOTE — Progress Notes (Signed)
Subjective:    Patient ID: Cynthia Castro, female    DOB: Aug 21, 1940, 73 y.o.   MRN: [id number]  HPI  This 73 y.o. Cauc female is here for eval of LLQ pain. It is intermittent in severity w/ onset 2 weeks ago. She has known hemorrhoids and rectocele per GYN provider. She is scheduled for GYN exam in May. Pain seems to decrease at night and is better after bowel movement but sensation of pressure persists. Pt also c/o LBP and pressure. (CT of Abd/pelvis Oct 2014 shows lumbar DDD). CRS - June 2013- normal; known to have sigmoid diverticulosis.  Patient Active Problem List   Diagnosis Date Noted  . DDD (degenerative disc disease), lumbar 11/24/2013  . Difficulty urinating 05/09/2013  . Rectocele 12/22/2012  . Diverticulosis of sigmoid colon 08/24/2012  . Seborrheic keratosis 06/27/2012  . Hypercholesteremia 04/21/2012  . Scoliosis of lumbar spine 04/21/2012  . Overweight (BMI 25.0-29.9) 04/21/2012    Prior to Admission medications   Medication Sig Start Date End Date Taking? Authorizing Provider  conjugated estrogens (PREMARIN) vaginal cream Place vaginally daily. Place 1/2 gram in the vagina at night twice a week. 05/05/13  Yes Barton Fanny, MD  diclofenac (VOLTAREN) 75 MG EC tablet Take 1 tablet (75 mg total) by mouth 2 (two) times daily. Take as needed with food. 05/05/13  Yes Barton Fanny, MD  fish oil-omega-3 fatty acids 1000 MG capsule Take 2 g by mouth daily.   Yes Historical Provider, MD  IBUPROFEN PO Take by mouth as needed.   Yes Historical Provider, MD  MELATONIN PO Take by mouth as needed.   Yes Historical Provider, MD  Multiple Vitamins-Minerals (MULTIVITAMIN PO) Take by mouth daily.   Yes Historical Provider, MD  TURMERIC PO Take by mouth. CAPSULE   Yes Historical Provider, MD         traMADol (ULTRAM) 50 MG tablet Take 1 tablet (50 mg total) by mouth every 8 (eight) hours as needed for pain. 05/20/13   Thao P Le, DO  zoster vaccine live, PF, (ZOSTAVAX) 09983  UNT/0.65ML injection Inject 19,400 Units into the skin once. 05/05/13   Barton Fanny, MD   PMHx, Surg Hx, Soc and Fam Hx reviewed.   Review of Systems  Constitutional: Positive for appetite change. Negative for fever, chills, diaphoresis, fatigue and unexpected weight change.  Respiratory: Negative.   Cardiovascular: Negative.   Gastrointestinal: Positive for nausea and abdominal pain. Negative for vomiting, diarrhea, constipation, blood in stool and abdominal distention.  Endocrine: Negative.   Genitourinary: Positive for pelvic pain. Negative for dysuria, hematuria, flank pain, difficulty urinating and vaginal pain.  Musculoskeletal: Positive for back pain. Negative for gait problem.  Skin: Negative.   Neurological: Negative.   Psychiatric/Behavioral: Negative.       Objective:   Physical Exam  Nursing note and vitals reviewed. Constitutional: Vital signs are normal. She appears well-developed and well-nourished. No distress.  HENT:  Head: Normocephalic and atraumatic.  Right Ear: External ear normal.  Left Ear: External ear normal.  Mouth/Throat: Oropharynx is clear and moist. No oropharyngeal exudate.  Eyes: Conjunctivae and EOM are normal. Pupils are equal, round, and reactive to light. No scleral icterus.  Neck: Normal range of motion. Neck supple. No thyromegaly present.  Cardiovascular: Normal rate, regular rhythm and normal heart sounds.   Pulmonary/Chest: Effort normal and breath sounds normal. No respiratory distress.  Abdominal: Soft. Normal appearance. She exhibits no distension, no pulsatile midline mass and no mass. Bowel sounds  are decreased. There is no hepatosplenomegaly. There is tenderness in the left lower quadrant. There is no rebound, no guarding and no CVA tenderness.  Genitourinary: Rectal exam shows external hemorrhoid and tenderness. Rectal exam shows no fissure, no mass and anal tone normal. Guaiac positive stool. There is no rash, tenderness or  lesion on the right labia. There is no rash, tenderness or lesion on the left labia. Right adnexum displays no mass, no tenderness and no fullness. Left adnexum displays tenderness. Left adnexum displays no mass and no fullness. There is erythema around the vagina. No tenderness around the vagina. No signs of injury around the vagina. No vaginal discharge found.  Lymphadenopathy:       Right: No inguinal adenopathy present.       Left: No inguinal adenopathy present.  Neurological: She is alert.  Skin: She is not diaphoretic.    Results for orders placed in visit on 11/24/13  POCT URINALYSIS DIPSTICK      Result Value Ref Range   Color, UA yellow     Clarity, UA clear     Glucose, UA neg     Bilirubin, UA neg     Ketones, UA neg     Spec Grav, UA 1.010     Blood, UA neg     pH, UA 6.0     Protein, UA neg     Urobilinogen, UA 0.2     Nitrite, UA neg     Leukocytes, UA Trace    IFOBT (OCCULT BLOOD)      Result Value Ref Range   IFOBT Positive        Assessment & Plan:  Abdominal pain, left lower quadrant - Suspect falre of sigmoid diverticular disease. Plan: POCT urinalysis dipstick, IFOBT POC (occult bld, rslt in office), US Pelvis Complete, oxyCODONE-acetaminophen (PERCOCET/ROXICET) 5-325 MG per tablet  Adnexal pain - Plan: US Pelvis Complete  DDD (degenerative disc disease), lumbar  Fecal occult blood test positive- Pt to take home Hemosure x 1 and return it next week.   Meds ordered this encounter  Medications         . IBUPROFEN PO    Sig: Take by mouth as needed.  Marland Kitchen oxyCODONE-acetaminophen (PERCOCET/ROXICET) 5-325 MG per tablet    Sig: Take 1 tablet by mouth every 8 (eight) hours as needed. Prn for severe pain    Dispense:  20 tablet    Refill:  0

## 2013-11-24 NOTE — Patient Instructions (Addendum)
Fiber Content in Foods Drinking plenty of fluids and consuming foods high in fiber can help with constipation. See the list below for the fiber content of some common foods. Starches and Grains / Dietary Fiber (g)  Cheerios, 1 cup / 3 g  Kellogg's Corn Flakes, 1 cup / 0.7 g  Rice Krispies, 1  cup / 0.3 g  Quaker Oat Life Cereal,  cup / 2.1 g  Oatmeal, instant (cooked),  cup / 2 g  Kellogg's Frosted Mini Wheats, 1 cup / 5.1 g  Rice, brown, long-grain (cooked), 1 cup / 3.5 g  Rice, white, long-grain (cooked), 1 cup / 0.6 g  Macaroni, cooked, enriched, 1 cup / 2.5 g Legumes / Dietary Fiber (g)  Beans, baked, canned, plain or vegetarian,  cup / 5.2 g  Beans, kidney, canned,  cup / 6.8 g  Beans, pinto, dried (cooked),  cup / 7.7 g  Beans, pinto, canned,  cup / 5.5 g Breads and Crackers / Dietary Fiber (g)  Graham crackers, plain or honey, 2 squares / 0.7 g  Saltine crackers, 3 squares / 0.3 g  Pretzels, plain, salted, 10 pieces / 1.8 g  Bread, whole-wheat, 1 slice / 1.9 g  Bread, white, 1 slice / 0.7 g  Bread, raisin, 1 slice / 1.2 g  Bagel, plain, 3 oz / 2 g  Tortilla, flour, 1 oz / 0.9 g  Tortilla, corn, 1 small / 1.5 g  Bun, hamburger or hotdog, 1 small / 0.9 g Fruits / Dietary Fiber (g)  Apple, raw with skin, 1 medium / 4.4 g  Applesauce, sweetened,  cup / 1.5 g  Banana,  medium / 1.5 g  Grapes, 10 grapes / 0.4 g  Orange, 1 small / 2.3 g  Raisin, 1.5 oz / 1.6 g  Melon, 1 cup / 1.4 g Vegetables / Dietary Fiber (g)  Green beans, canned,  cup / 1.3 g  Carrots (cooked),  cup / 2.3 g  Broccoli (cooked),  cup / 2.8 g  Peas, frozen (cooked),  cup / 4.4 g  Potatoes, mashed,  cup / 1.6 g  Lettuce, 1 cup / 0.5 g  Corn, canned,  cup / 1.6 g  Tomato,  cup / 1.1 g Document Released: 12/06/2006 Document Revised: 10/12/2011 Document Reviewed: 01/31/2007 [employer] Patient Information 2014 [place], [place].     Pelvic Pain Pelvic  pain is pain felt below the belly button and between your hips. It can be caused by many different things. It is important to get help right away. This is especially true for severe, sharp, or unusual pain that comes on suddenly.  HOME CARE  Only take medicine as told by your doctor.  Rest as told by your doctor.  Eat a healthy diet, such as fruits, vegetables, and lean meats.  Drink enough fluids to keep your pee (urine) clear or pale yellow, or as told.  Avoid sex (intercourse) if it causes pain.  Apply warm or cold packs to your lower belly (abdomen). Use the type of pack that helps the pain.  Avoid situations that cause you stress.  Keep a journal to track your pain. Write down:  When the pain started.  Where it is located.  If there are things that seem to be related to the pain, such as food or your period.  Follow up with your doctor as told. GET HELP RIGHT AWAY IF:   You have heavy bleeding from the vagina.  You have more pelvic pain.  You feel lightheaded or pass out (faint).  You have chills.  You have pain when you pee or have blood in your pee.  You cannot stop having watery poop (diarrhea).  You cannot stop throwing up (vomiting).  You have a fever or lasting symptoms for more than 3 days.  You have a fever and your symptoms suddenly get worse.  You are being physically or sexually abused.  Your medicine does not help your pain.  You have fluid (discharge) coming from your vagina that is not normal. MAKE SURE YOU:  Understand these instructions.  Will watch your condition.  Will get help if you are not doing well or get worse. Document Released: 01/06/2008 Document Revised: 01/19/2012 Document Reviewed: 11/09/2011 [facility] Patient Information 2014 [place], [place].    Your Hemosure in the office is POSITIVE; I want you to do one at home and return it next week. If the pelvic ultrasound is normal, you may need to see your GI specialist for  evaluation of the positive stool and pain in the left pelvic.

## 2013-11-27 ENCOUNTER — Other Ambulatory Visit: Payer: Self-pay | Admitting: Family Medicine

## 2013-11-27 DIAGNOSIS — R1032 Left lower quadrant pain: Secondary | ICD-10-CM

## 2013-11-27 DIAGNOSIS — R102 Pelvic and perineal pain: Secondary | ICD-10-CM

## 2013-11-29 ENCOUNTER — Other Ambulatory Visit: Payer: Self-pay | Admitting: Family Medicine

## 2013-11-29 ENCOUNTER — Ambulatory Visit
Admission: RE | Admit: 2013-11-29 | Discharge: 2013-11-29 | Disposition: A | Discharge status: Home / Self Care | Payer: Medicare Other | Source: Ambulatory Visit | Attending: Family Medicine | Admitting: Family Medicine

## 2013-11-29 DIAGNOSIS — R102 Pelvic and perineal pain unspecified side: Secondary | ICD-10-CM

## 2013-11-29 DIAGNOSIS — R1032 Left lower quadrant pain: Secondary | ICD-10-CM

## 2013-11-29 DIAGNOSIS — R195 Other fecal abnormalities: Secondary | ICD-10-CM

## 2013-11-29 NOTE — Progress Notes (Signed)
Quick Note:  Your recent imaging study is normal. The ultrasound shows 1 small fibroid in the uterus and normal appearing ovaries.  Your GYN can help with further evaluation.  As per our visit, I advised GI evaluation if your ultrasound is normal. I am ordering a referral for further evaluation with your Gastroenterologist, Dr. Michail Sermon. ______

## 2013-11-30 ENCOUNTER — Telehealth: Payer: Self-pay | Admitting: Family Medicine

## 2013-11-30 NOTE — Telephone Encounter (Signed)
I phoned pt and left a message to let her know that [place] was normal (staff instructed to call w/ results). I let her know that I ordered GI referral as we discussed at her recent visit.

## 2013-12-04 LAB — IFOBT (OCCULT BLOOD): IMMUNOLOGICAL FECAL OCCULT BLOOD TEST: POSITIVE

## 2013-12-04 NOTE — Addendum Note (Signed)
Addended by: Yvette Rack on: 12/04/2013 03:59 PM   Modules accepted: Orders

## 2013-12-12 ENCOUNTER — Encounter: Payer: Self-pay | Admitting: Family Medicine

## 2013-12-28 ENCOUNTER — Ambulatory Visit (INDEPENDENT_AMBULATORY_CARE_PROVIDER_SITE_OTHER): Payer: Medicare Other | Admitting: Obstetrics and Gynecology

## 2013-12-28 ENCOUNTER — Encounter: Payer: Self-pay | Admitting: Obstetrics and Gynecology

## 2013-12-28 VITALS — BP 112/60 | HR 60 | Ht 65.0 in | Wt 173.0 lb

## 2013-12-28 DIAGNOSIS — Z01419 Encounter for gynecological examination (general) (routine) without abnormal findings: Secondary | ICD-10-CM

## 2013-12-28 DIAGNOSIS — Z1239 Encounter for other screening for malignant neoplasm of breast: Secondary | ICD-10-CM

## 2013-12-28 MED ORDER — ESTROGENS, CONJUGATED 0.625 MG/GM VA CREA: TOPICAL_CREAM | Freq: Every day | VAGINAL | Status: DC

## 2013-12-28 NOTE — Progress Notes (Signed)
Patient ID: Cynthia Castro, female   DOB: 1940-09-30, 73 y.o.   MRN: [id number] GYNECOLOGY VISIT  PCP:   Ellsworth Lennox, MD  Referring provider:   HPI: 73 y.o.   Divorced  Caucasian  female   G4P3 with Patient's last menstrual period was 08/03/1998.   here for  AEX.  Premarin vaginal cream is very expensive for the patient.   Has second degree rectocele. Does some straining. No urinary incontinence. Some difficulty starting stream of urine.   Had a kidney stone.  Had lithotripsy.   Hgb:    PCP Urine:  PCP  GYNECOLOGIC HISTORY: Patient's last menstrual period was 08/03/1998. Sexually active:  no Partner preference: female Contraception:  Tubal ligation/ postmenopausal   Menopausal hormone therapy: Premarin vaginal cream occ. DES exposure: no   Blood transfusions:   no Sexually transmitted diseases:  no  GYN procedures and prior surgeries: Hysteroscopic resection of polyp 06/2006, tubal ligation  Last mammogram: 12-30-12 wnl:[facility]                Last pap and high risk HPV testing:   10-31-09 wnl History of abnormal pap smear:  no   OB History   Grav Para Term Preterm Abortions TAB SAB Ect Mult Living   4 3               LIFESTYLE: Exercise:  Walking, yoga and water aerobics             Tobacco:  no Alcohol:    1 drink per week Drug use:  no  OTHER HEALTH MAINTENANCE: Tetanus/TDap:  Up to date with PCP Gardisil:             n/a Influenza:           05/2013 Zostavax:           Has Rx to take to pharmacy  Bone density:     10/2006 wnl:[facility] Colonoscopy:     2013 wnl with Dr. Michail Sermon at [place].  Next colonoscopy due 2023.  Cholesterol check:  03/2013 wnl with PCP  Family History  Problem Relation Age of Onset  . Diabetes Mother   . Heart disease Mother     CHF  . Vision loss Mother   . Cancer Father     TESTICULAR  . Stroke Father 51  . Diabetes Maternal Grandmother   . Heart disease Maternal Grandfather   . Anxiety disorder  Brother   . Seizures Brother     Patient Active Problem List   Diagnosis Date Noted  . DDD (degenerative disc disease), lumbar 11/24/2013  . Difficulty urinating 05/09/2013  . Rectocele 12/22/2012  . Diverticulosis of sigmoid colon 08/24/2012  . Seborrheic keratosis 06/27/2012  . Hypercholesteremia 04/21/2012  . Scoliosis of lumbar spine 04/21/2012  . Overweight (BMI 25.0-29.9) 04/21/2012   Past Medical History  Diagnosis Date  . Arthritis   . Hyperlipidemia   . DDD (degenerative disc disease), cervical   . History of kidney stones 05/2013    Past Surgical History  Procedure Laterality Date  . Cholecystectomy  2005  . Cesarean section  1984  . Cystectomy  2003  . Uterine cyst removal  2005  . Hysteroscopic resection      hysteroscopic resection of polyp  . Lithotripsy  04/2013    --Dr. Janice Norrie    ALLERGIES: Review of patient's allergies indicates no known allergies.  Current Outpatient Prescriptions  Medication Sig Dispense Refill  . conjugated estrogens (  PREMARIN) vaginal cream Place vaginally daily. Place 1/2 gram in the vagina at night twice a week.  60 g  3  . diclofenac (VOLTAREN) 75 MG EC tablet Take 1 tablet (75 mg total) by mouth 2 (two) times daily. Take as needed with food.  60 tablet  2  . fish oil-omega-3 fatty acids 1000 MG capsule Take 2 g by mouth daily.      . IBUPROFEN PO Take by mouth as needed.      Marland Kitchen MELATONIN PO Take by mouth as needed.      . Multiple Vitamins-Minerals (MULTIVITAMIN PO) Take by mouth daily.      . TURMERIC PO Take by mouth. CAPSULE      . zoster vaccine live, PF, (ZOSTAVAX) 37106 UNT/0.65ML injection Inject 19,400 Units into the skin once.  1 each  0  . oxyCODONE-acetaminophen (PERCOCET/ROXICET) 5-325 MG per tablet Take 1 tablet by mouth every 8 (eight) hours as needed. Prn for severe pain  20 tablet  0   No current facility-administered medications for this visit.     ROS:  Pertinent items are noted in HPI.  SOCIAL HISTORY:     PHYSICAL EXAMINATION:    BP 112/60  Pulse 60  Ht 5\' 5"  (1.651 m)  Wt 173 lb (78.472 kg)  BMI 28.79 kg/m2  LMP 08/03/1998   Wt Readings from Last 3 Encounters:  12/28/13 173 lb (78.472 kg)  11/24/13 173 lb 9.6 oz (78.744 kg)  05/29/13 169 lb 2 oz (76.715 kg)     Ht Readings from Last 3 Encounters:  12/28/13 5\' 5"  (1.651 m)  11/24/13 5\' 5"  (1.651 m)  05/29/13 5\' 5"  (1.651 m)    General appearance: alert, cooperative and appears stated age Head: Normocephalic, without obvious abnormality, atraumatic Neck: no adenopathy, supple, symmetrical, trachea midline and thyroid not enlarged, symmetric, no tenderness/mass/nodules Lungs: clear to auscultation bilaterally Breasts: Inspection negative, No nipple retraction or dimpling, No nipple discharge or bleeding, No axillary or supraclavicular adenopathy, Normal to palpation without dominant masses Heart: regular rate and rhythm Abdomen: soft, non-tender; no masses,  no organomegaly Extremities: extremities normal, atraumatic, no cyanosis or edema Skin: Skin color, texture, turgor normal. No rashes or lesions Lymph nodes: Cervical, supraclavicular, and axillary nodes normal. No abnormal inguinal nodes palpated Neurologic: Grossly normal  Pelvic: External genitalia:  no lesions              Urethra:  normal appearing urethra with no masses, tenderness or lesions              Bartholins and Skenes: normal                 Vagina: normal appearing vagina with normal color and discharge, no lesions, minimal cystocele, second degree rectocele, good uterine support.               Cervix: normal appearance              Pap and high risk HPV testing done: no.            Bimanual Exam:  Uterus:  uterus is normal size, shape, consistency and nontender                                      Adnexa: normal adnexa in size, nontender and no masses  Rectovaginal: Confirms                                      Anus:   normal sphincter tone, no lesions  ASSESSMENT  Normal gynecologic exam. Rectocele.  Stable.  Constipation.   PLAN  Mammogram recommended yearly.  Patient will call to discuss.  Pap smear and high risk HPV testing not indicated.  Discussed Colace, Metamucil, and Miralax. Discussed pessary use.  Handout on pelvic organ prolapse from ACOG to patient.  Rx for Premarin cream 1/2 gm pv at hs twice a week.  Disp:  30 gram, RF 3.  Discussed risks of breast cancer, DVT, PE, MI and stroke.  Counseled on self breast exam, Calcium and vitamin D intake, exercise. Return annually or prn   An After Visit Summary was printed and given to the patient.

## 2013-12-28 NOTE — Patient Instructions (Signed)

## 2014-01-30 ENCOUNTER — Encounter: Payer: Self-pay | Admitting: Family Medicine

## 2014-01-30 ENCOUNTER — Ambulatory Visit (INDEPENDENT_AMBULATORY_CARE_PROVIDER_SITE_OTHER): Payer: Medicare Other | Admitting: Family Medicine

## 2014-01-30 VITALS — BP 104/50 | HR 63 | Temp 98.3°F | Resp 16 | Ht 65.0 in | Wt 173.2 lb

## 2014-01-30 DIAGNOSIS — M4120 Other idiopathic scoliosis, site unspecified: Secondary | ICD-10-CM

## 2014-01-30 DIAGNOSIS — M5136 Other intervertebral disc degeneration, lumbar region: Secondary | ICD-10-CM

## 2014-01-30 DIAGNOSIS — M5137 Other intervertebral disc degeneration, lumbosacral region: Secondary | ICD-10-CM

## 2014-01-30 DIAGNOSIS — M419 Scoliosis, unspecified: Secondary | ICD-10-CM

## 2014-01-30 DIAGNOSIS — R109 Unspecified abdominal pain: Secondary | ICD-10-CM

## 2014-01-30 NOTE — Patient Instructions (Addendum)

## 2014-01-30 NOTE — Progress Notes (Signed)
Subjective:    Patient ID: Cynthia Castro, female    DOB: April 11, 1941, 73 y.o.   MRN: [id number]  HPI  This 73 y.o. cauc female returns for follow-up re: LLQ pain. She was seen by Dr. Michail Sermon who did not think her pain was GI-related. By the time she was seen by GI, her pain  had subsided. Dr. Michail Sermon advised pt to be evaluated by Urology or [facility]. She has seen neither. She feels better, w/ occasional "soreness" in L flank. She has 3-4 days of discomfort under the L ribs and mid-back. This was not accompanied by fever, CP or tightness, SOB or cough, n/v/d, dysuria or change in urine. She has normal bowel habits now and passes gas normally. Colace helps.  Pt has hx of scoliosis; she does yoga and maintains regular exercise schedule.  Patient Active Problem List   Diagnosis Date Noted  . DDD (degenerative disc disease), lumbar 11/24/2013  . Difficulty urinating 05/09/2013  . Rectocele 12/22/2012  . Diverticulosis of sigmoid colon 08/24/2012  . Seborrheic keratosis 06/27/2012  . Hypercholesteremia 04/21/2012  . Scoliosis of lumbar spine 04/21/2012  . Overweight (BMI 25.0-29.9) 04/21/2012   Prior to Admission medications   Medication Sig Start Date End Date Taking? Authorizing Provider  diclofenac (VOLTAREN) 75 MG EC tablet Take 1 tablet (75 mg total) by mouth 2 (two) times daily. Take as needed with food. 05/05/13  Yes Barton Fanny, MD  fish oil-omega-3 fatty acids 1000 MG capsule Take 2 g by mouth daily.   Yes Historical Provider, MD  IBUPROFEN PO Take by mouth as needed.   Yes Historical Provider, MD  MELATONIN PO Take by mouth as needed.   Yes Historical Provider, MD  Multiple Vitamins-Minerals (MULTIVITAMIN PO) Take by mouth daily.   Yes Historical Provider, MD  TURMERIC PO Take by mouth. CAPSULE   Yes Historical Provider, MD  zoster vaccine live, PF, (ZOSTAVAX) 51761 UNT/0.65ML injection Inject 19,400 Units into the skin once. 05/05/13   Barton Fanny, MD   PMHx, Surg  Hx, Soc and Fam hx reviewed.   Review of Systems  As per HPI.     Objective:   Physical Exam  Nursing note and vitals reviewed. Constitutional: She is oriented to person, place, and time. She appears well-developed and well-nourished. No distress.  HENT:  Head: Normocephalic and atraumatic.  Right Ear: External ear normal.  Left Ear: External ear normal.  Nose: Nose normal.  Mouth/Throat: Oropharynx is clear and moist.  Eyes: Conjunctivae and EOM are normal. Pupils are equal, round, and reactive to light. No scleral icterus.  Neck: Normal range of motion. Neck supple.  Cardiovascular: Normal rate and regular rhythm.   Pulmonary/Chest: Effort normal. No respiratory distress.  Abdominal: Soft. Normal appearance and bowel sounds are normal. She exhibits no distension, no pulsatile midline mass and no mass. There is no hepatosplenomegaly. There is no tenderness. There is no guarding and no CVA tenderness.  Musculoskeletal: Normal range of motion. She exhibits no edema and no tenderness.       Cervical back: Normal.       Thoracic back: Normal.       Lumbar back: She exhibits deformity and spasm. She exhibits normal range of motion and no pain.  Neurological: She is alert and oriented to person, place, and time. No cranial nerve deficit. She exhibits normal muscle tone. Coordination normal.  Skin: Skin is warm and dry. She is not diaphoretic.  Psychiatric: She has a normal mood and  affect. Her behavior is normal. Judgment and thought content normal.    I reviewed LS spine films from Sept 2013 w/ pt; dextroconvex scoliosis as well as diffusely advanced DDD of lumbar spine.     Assessment & Plan:  Abdominal pain in female patient- Resolved ; I suspect this is referred pain associated w/ advanced lumbar DDD.  Scoliosis of lumbar spine- Continue regular exercises; consider updating imaging at next visit.  DDD (degenerative disc disease), lumbar - As above.

## 2014-01-31 ENCOUNTER — Ambulatory Visit: Payer: Medicare Other

## 2014-02-05 ENCOUNTER — Ambulatory Visit: Payer: Medicare Other

## 2014-02-05 ENCOUNTER — Ambulatory Visit
Admission: RE | Admit: 2014-02-05 | Discharge: 2014-02-05 | Disposition: A | Discharge status: Home / Self Care | Payer: Medicare Other | Source: Ambulatory Visit | Attending: Obstetrics and Gynecology | Admitting: Obstetrics and Gynecology

## 2014-02-05 DIAGNOSIS — Z1239 Encounter for other screening for malignant neoplasm of breast: Secondary | ICD-10-CM

## 2014-03-28 ENCOUNTER — Ambulatory Visit (INDEPENDENT_AMBULATORY_CARE_PROVIDER_SITE_OTHER): Payer: Medicare Other | Admitting: Emergency Medicine

## 2014-03-28 VITALS — BP 122/80 | HR 63 | Temp 98.1°F | Resp 16 | Ht 65.5 in | Wt 170.0 lb

## 2014-03-28 DIAGNOSIS — R21 Rash and other nonspecific skin eruption: Secondary | ICD-10-CM

## 2014-03-28 LAB — POCT SKIN KOH: Skin KOH, POC: NEGATIVE

## 2014-03-28 MED ORDER — BETAMETHASONE DIPROPIONATE AUG 0.05 % EX CREA: TOPICAL_CREAM | Freq: Two times a day (BID) | CUTANEOUS | Status: DC

## 2014-03-28 NOTE — Progress Notes (Signed)
   Subjective:  This chart was scribed for Cynthia Russian, MD by [name], ED Scribe. The patient was seen in room 3. Patient's care was started at 8:19 AM.   Patient ID: Cynthia Castro, female    DOB: 04-05-1941, 73 y.o.   MRN: [id number]  Chief Complaint  Patient presents with  . Rash    Under both arms and sides x 3 days   HPI HPI Comments: Cynthia Castro is a 73 y.o. female who presents to the [facility] complaining of rash under bilateral arms and sides first noted 3 days ago. She denies new deodorant. Pt states that she wore a new knit shirt that she washed prior to wearing. She reports swinging her arms back and forth while using hiking poles for a 4 mile walk. Pt also reports that the rash is very unusual for her. She denies itching. She also denies cold-like symptoms. Pt has not tried anything for the rash.   Past Medical History  Diagnosis Date  . Arthritis   . Hyperlipidemia   . DDD (degenerative disc disease), cervical   . History of kidney stones 05/2013   Current Outpatient Prescriptions on File Prior to Visit  Medication Sig Dispense Refill  . diclofenac (VOLTAREN) 75 MG EC tablet Take 1 tablet (75 mg total) by mouth 2 (two) times daily. Take as needed with food.  60 tablet  2  . fish oil-omega-3 fatty acids 1000 MG capsule Take 2 g by mouth daily.      . IBUPROFEN PO Take by mouth as needed.      Marland Kitchen MELATONIN PO Take by mouth as needed.      . Multiple Vitamins-Minerals (MULTIVITAMIN PO) Take by mouth daily.      . TURMERIC PO Take by mouth. CAPSULE      . zoster vaccine live, PF, (ZOSTAVAX) 26378 UNT/0.65ML injection Inject 19,400 Units into the skin once.  1 each  0   No current facility-administered medications on file prior to visit.   No Known Allergies  Review of Systems  Constitutional: Negative for fever, chills and fatigue.  HENT: Negative for congestion, rhinorrhea and sore throat.   Respiratory: Negative for chest tightness  and shortness of breath.   Cardiovascular: Negative for chest pain, palpitations and leg swelling.  Gastrointestinal: Negative for abdominal pain.  Skin: Positive for rash.  Neurological: Negative for dizziness, light-headedness and headaches.      Objective:   Physical Exam CONSTITUTIONAL: Well developed/well nourished HEAD: Normocephalic/atraumatic EYES: EOMI/PERRL ENMT: Mucous membranes moist NECK: supple no meningeal signs SPINE:entire spine nontender CV: S1/S2 noted, no murmurs/rubs/gallops noted LUNGS: Lungs are clear to auscultation bilaterally, no apparent distress ABDOMEN: soft, nontender, no rebound or guarding GU:no cva tenderness NEURO: Pt is awake/alert, moves all extremitiesx4 EXTREMITIES: pulses normal, full ROM SKIN: warm, color normal, 1x2 cm raised red patches in axillary areas of both arms  PSYCH: no abnormalities of mood noted    Assessment & Plan:  This looks traumatic to me. She will pad the area and wear a cotton T-shirt. We'll use Diprolene to clear the rash. I personally performed the services described in this documentation, which was scribed in my presence. The recorded information has been reviewed and is accurate.

## 2014-03-29 ENCOUNTER — Encounter: Payer: Self-pay | Admitting: Obstetrics and Gynecology

## 2014-05-08 ENCOUNTER — Encounter: Payer: Self-pay | Admitting: Family Medicine

## 2014-05-08 ENCOUNTER — Ambulatory Visit (INDEPENDENT_AMBULATORY_CARE_PROVIDER_SITE_OTHER): Payer: Medicare Other | Admitting: Family Medicine

## 2014-05-08 VITALS — BP 90/58 | HR 58 | Temp 97.8°F | Resp 16 | Ht 65.5 in | Wt 171.2 lb

## 2014-05-08 DIAGNOSIS — Z0000 Encounter for general adult medical examination without abnormal findings: Secondary | ICD-10-CM

## 2014-05-08 DIAGNOSIS — E7800 Pure hypercholesterolemia, unspecified: Secondary | ICD-10-CM

## 2014-05-08 DIAGNOSIS — Z23 Encounter for immunization: Secondary | ICD-10-CM

## 2014-05-08 LAB — LIPID PANEL
CHOL/HDL RATIO: 4.5 ratio
CHOLESTEROL: 223 mg/dL — AB (ref 0–200)
HDL: 50 mg/dL (ref >39)
LDL CALC: 129 mg/dL — AB (ref 0–99)
Triglycerides: 220 mg/dL — ABNORMAL HIGH (ref <150)
VLDL: 44 mg/dL — AB (ref 0–40)

## 2014-05-08 LAB — COMPLETE METABOLIC PANEL WITH GFR
ALBUMIN: 4.2 g/dL (ref 3.5–5.2)
ALT: 16 U/L (ref 0–35)
AST: 15 U/L (ref 0–37)
Alkaline Phosphatase: 83 U/L (ref 39–117)
BUN: 15 mg/dL (ref 6–23)
CHLORIDE: 104 meq/L (ref 96–112)
CO2: 27 mEq/L (ref 19–32)
Calcium: 9 mg/dL (ref 8.4–10.5)
Creat: 0.81 mg/dL (ref 0.50–1.10)
GFR, Est African American: 84 mL/min
GFR, Est Non African American: 73 mL/min
Glucose, Bld: 79 mg/dL (ref 70–99)
Potassium: 4.4 mEq/L (ref 3.5–5.3)
SODIUM: 141 meq/L (ref 135–145)
TOTAL PROTEIN: 6.8 g/dL (ref 6.0–8.3)
Total Bilirubin: 0.7 mg/dL (ref 0.2–1.2)

## 2014-05-08 NOTE — Progress Notes (Signed)
Subjective:    Patient ID: Cynthia Castro, female    DOB: 02/08/1941, 73 y.o.   MRN: [id number]  HPI  This 73 y.o. Cauc female is here for [facility] Subsequent Wellness exam. Pt practices healthy lifestyle w/ nutrition, yoga, walking and [facility].   HCM: MMG- Current.           CRS- Curent (2013- recall at 10 years).           IMM-Curent.           Vision- Current- (last week w/ Dr. Katy Fitch).           DEXA- Pt declines ; last 2 studies have been normal.   Patient Active Problem List   Diagnosis Date Noted  . DDD (degenerative disc disease), lumbar 11/24/2013  . Difficulty urinating 05/09/2013  . Rectocele 12/22/2012  . Diverticulosis of sigmoid colon 08/24/2012  . Seborrheic keratosis 06/27/2012  . Hypercholesteremia 04/21/2012  . Scoliosis of lumbar spine 04/21/2012  . Overweight (BMI 25.0-29.9) 04/21/2012    Prior to Admission medications   Medication Sig Start Date End Date Taking? Authorizing Provider  augmented betamethasone dipropionate (DIPROLENE AF) 0.05 % cream Apply topically 2 (two) times daily. 03/28/14  Yes Darlyne Russian, MD  diclofenac (VOLTAREN) 75 MG EC tablet Take 1 tablet (75 mg total) by mouth 2 (two) times daily. Take as needed with food. 05/05/13  Yes Barton Fanny, MD  fish oil-omega-3 fatty acids 1000 MG capsule Take 2 g by mouth daily.   Yes Historical Provider, MD  IBUPROFEN PO Take by mouth as needed.   Yes Historical Provider, MD  MELATONIN PO Take by mouth as needed.   Yes Historical Provider, MD  Multiple Vitamins-Minerals (MULTIVITAMIN PO) Take by mouth daily.   Yes Historical Provider, MD  TURMERIC PO Take by mouth. CAPSULE   Yes Historical Provider, MD   History   Social History  . Marital Status: Divorced    Spouse Name: N/A    Number of Children: N/A  . Years of Education: N/A   Occupational History  . 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 Smoking status: Never Smoker   . Smokeless tobacco:  Never Used  . Alcohol Use: 1.2 oz/week    2 Glasses of wine per week     Comment: WINE OR BEER - 1 OR 2 A WEEK  . Drug Use: No  . Sexual Activity: No   Other Topics Concern  . Not on file   Social History Narrative   Divorced. Education: [employer]. Exercise: Walk/Yoga 3 times a week for 30 minutes.    Family History  Problem Relation Age of Onset  . Diabetes Mother   . Heart disease Mother     CHF  . Vision loss Mother   . Cancer Father     TESTICULAR  . Stroke Father 35  . Hyperlipidemia Father   . Hyperlipidemia Maternal Grandmother   . Heart disease Maternal Grandfather   . Diabetes Maternal Grandfather   . Anxiety disorder Brother   . Seizures Brother   . Cancer Paternal Grandmother   . Cancer Paternal Grandfather      Review of Systems  Constitutional: Negative.   HENT: Negative.   Eyes: Negative.   Respiratory: Negative.   Cardiovascular: Negative.   Gastrointestinal: Negative.   Endocrine: Positive for polyphagia.       Pt admits "emotional" eating- eats when bored, happy or  sad >> feels better.  Genitourinary: Negative.        C/o urinary frequency and nocturia but this is relieved when she has normal BMs; takes Colace which helps pelvic discomfort. Pt states this problem has been evaluated by GYN; rectocele discovered.  Musculoskeletal: Positive for neck pain and neck stiffness.       Has hx of scoliosis and lumbar DDD.  Skin: Negative.   Allergic/Immunologic: Negative.   Neurological: Negative.   Hematological: Negative.   Psychiatric/Behavioral: Negative.       Objective:   Physical Exam  Nursing note and vitals reviewed. Constitutional: She is oriented to person, place, and time. She appears well-developed and well-nourished. She does not appear ill. No distress.  BP noted to be low; usual BP range 100-120/ 60-80.  HENT:  Head: Normocephalic and atraumatic.  Right Ear: Hearing, tympanic membrane, external ear and ear canal normal.  Left Ear:  Hearing, tympanic membrane, external ear and ear canal normal.  Nose: Nose normal. No mucosal edema, nasal deformity or septal deviation.  Mouth/Throat: Uvula is midline, oropharynx is clear and moist and mucous membranes are normal. No oral lesions. Normal dentition.  Eyes: Conjunctivae, EOM and lids are normal. Pupils are equal, round, and reactive to light. No scleral icterus.  Neck: Trachea normal, normal range of motion, full passive range of motion without pain and phonation normal. Neck supple. No JVD present. No spinous process tenderness and no muscular tenderness present. Carotid bruit is not present. No mass and no thyromegaly present.  Cardiovascular: Normal rate, regular rhythm, S1 normal, S2 normal, normal heart sounds and normal pulses.   No extrasystoles are present. PMI is not displaced.  Exam reveals no gallop and no friction rub.   No murmur heard. Pulmonary/Chest: Effort normal and breath sounds normal. No respiratory distress. She has no decreased breath sounds. She has no wheezes.  Abdominal: Soft. Normal appearance and normal aorta. She exhibits no distension, no pulsatile midline mass and no mass. Bowel sounds are decreased. There is no hepatosplenomegaly. There is no tenderness. There is no guarding and no CVA tenderness.  Genitourinary:  Deferred.  Musculoskeletal:       Cervical back: Normal.       Thoracic back: Normal.       Lumbar back: Normal.  Remainder of exam unremarkable.  Lymphadenopathy:       Head (right side): No submental, no submandibular, no tonsillar, no preauricular, no posterior auricular and no occipital adenopathy present.       Head (left side): No submental, no submandibular, no tonsillar, no preauricular, no posterior auricular and no occipital adenopathy present.    She has no cervical adenopathy.    She has no axillary adenopathy.       Right: No inguinal and no supraclavicular adenopathy present.       Left: No inguinal and no supraclavicular  adenopathy present.  Neurological: She is alert and oriented to person, place, and time. She has normal strength and normal reflexes. She displays no atrophy. No cranial nerve deficit or sensory deficit. She exhibits normal muscle tone. She displays a negative Romberg sign. Coordination and gait normal.  Get up and Go test: < 10 sec  Skin: Skin is warm, dry and intact. No ecchymosis, no lesion and no rash noted. She is not diaphoretic. No cyanosis or erythema. No pallor. Nails show no clubbing.  Psychiatric: She has a normal mood and affect. Her speech is normal and behavior is normal. Judgment and thought content  normal. Cognition and memory are normal.       Assessment & Plan:  Normal routine history and physical examination - Re: low BP- advised pt to increase daily hydration (at least 8 glasses of water + other liquids) . Plan: COMPLETE METABOLIC PANEL WITH GFR  Elevated LDL cholesterol level - Currently taking only fish oil and maintaining healthy lifestyle.    Plan: Lipid panel  Need for prophylactic vaccination and inoculation against influenza - Plan: Flu Vaccine QUAD 36+ mos IM

## 2014-05-08 NOTE — Patient Instructions (Addendum)
Keeping You Healthy  Get These Tests  Blood Pressure- Have your blood pressure checked by your healthcare provider at least once a year.  Normal blood pressure is 120/80.  Weight- Have your body mass index (BMI) calculated to screen for obesity.  BMI is a measure of body fat based on height and weight.  You can calculate your own BMI at www.nhlbisupport.com/bmi/  Cholesterol- Have your cholesterol checked every year.  Diabetes- Have your blood sugar checked every year if you have high blood pressure, high cholesterol, a family history of diabetes or if you are overweight.  Pap Smear- Have a pap smear every 1 to 3 years if you have been sexually active.  If you are older than 65 and recent pap smears have been normal you may not need additional pap smears.  In addition, if you have had a hysterectomy  For benign disease additional pap smears are not necessary.  Mammogram-Yearly mammograms are essential for early detection of breast cancer  Screening for Colon Cancer- Colonoscopy starting at age 50. Screening may begin sooner depending on your family history and other health conditions.  Follow up colonoscopy as directed by your Gastroenterologist.  Screening for Osteoporosis- Screening begins at age 65 with bone density scanning, sooner if you are at higher risk for developing Osteoporosis.  Get these medicines  Calcium with Vitamin D- Your body requires 1200-1500 mg of Calcium a day and 800-1000 IU of Vitamin D a day.  You can only absorb 500 mg of Calcium at a time therefore Calcium must be taken in 2 or 3 separate doses throughout the day.  Hormones- Hormone therapy has been associated with increased risk for certain cancers and heart disease.  Talk to your healthcare provider about if you need relief from menopausal symptoms.  Aspirin- Ask your healthcare provider about taking Aspirin to prevent Heart Disease and Stroke.  Get these Immuniztions  Flu shot- Every fall  Pneumonia  shot- Once after the age of 65; if you are younger ask your healthcare provider if you need a pneumonia shot.  Tetanus- Every ten years.  Zostavax- Once after the age of 60 to prevent shingles.  Take these steps  Don't smoke- Your healthcare provider can help you quit. For tips on how to quit, ask your healthcare provider or go to www.smokefree.gov or call 1-800 QUIT-NOW.  Be physically active- Exercise 5 days a week for a minimum of 30 minutes.  If you are not already physically active, start slow and gradually work up to 30 minutes of moderate physical activity.  Try walking, dancing, bike riding, swimming, etc.  Eat a healthy diet- Eat a variety of healthy foods such as fruits, vegetables, whole grains, low fat milk, low fat cheeses, yogurt, lean meats, chicken, fish, eggs, dried beans, tofu, etc.  For more information go to www.thenutritionsource.org  Dental visit- Brush and floss teeth twice daily; visit your dentist twice a year.  Eye exam- Visit your Optometrist or Ophthalmologist yearly.  Drink alcohol in moderation- Limit alcohol intake to one drink or less a day.  Never drink and drive.  Depression- Your emotional health is as important as your physical health.  If you're feeling down or losing interest in things you normally enjoy, please talk to your healthcare provider.  Seat Belts- can save your life; always wear one  Smoke/Carbon Monoxide detectors- These detectors need to be installed on the appropriate level of your home.  Replace batteries at least once a year.  Violence- If anyone   is threatening or hurting you, please tell your healthcare provider.  Living Will/ Health care power of attorney- Discuss with your healthcare provider and family.   We discussed urinary frequency but your remedies have been effective. Take Colace daily if you need to for bowel regularity and softer stools. Try to increase fluid intake to help support a healthy blood pressure. If the  bladder and pelvic symptoms persist, make an appointment; you may have to see a Urologist. Continue to stay active since you report that walking helps these symptoms also.       Mediterranean Diet  Why follow it? Research shows.   Those who follow the Mediterranean diet have a reduced risk of heart disease    The diet is associated with a reduced incidence of Parkinson's and Alzheimer's diseases   People following the diet may have longer life expectancies and lower rates of chronic diseases    The Dietary Guidelines for Americans recommends the Mediterranean diet as an eating plan to promote health and prevent disease  What Is the Mediterranean Diet?    Healthy eating plan based on typical foods and recipes of Mediterranean-style cooking   The diet is primarily a plant based diet; these foods should make up a majority of meals   Starches - Plant based foods should make up a majority of meals - They are an important sources of vitamins, minerals, energy, antioxidants, and fiber - Choose whole grains, foods high in fiber and minimally processed items  - Typical grain sources include wheat, oats, barley, corn, brown rice, bulgar, farro, millet, polenta, couscous  - Various types of beans include chickpeas, lentils, fava beans, black beans, white beans   Fruits  Veggies - Large quantities of antioxidant rich fruits & veggies; 6 or more servings  - Vegetables can be eaten raw or lightly drizzled with oil and cooked  - Vegetables common to the traditional Mediterranean Diet include: artichokes, arugula, beets, broccoli, brussel sprouts, cabbage, carrots, celery, collard greens, cucumbers, eggplant, kale, leeks, lemons, lettuce, mushrooms, okra, onions, peas, peppers, potatoes, pumpkin, radishes, rutabaga, shallots, spinach, sweet potatoes, turnips, zucchini - Fruits common to the Mediterranean Diet include: apples, apricots, avocados, cherries, clementines, dates, figs, grapefruits, grapes, melons,  nectarines, oranges, peaches, pears, pomegranates, strawberries, tangerines  Fats - Replace butter and margarine with healthy oils, such as olive oil, canola oil, and tahini  - Limit nuts to no more than a handful a day  - Nuts include walnuts, almonds, pecans, pistachios, pine nuts  - Limit or avoid candied, honey roasted or heavily salted nuts - Olives are central to the [employer] - can be eaten whole or used in a variety of dishes   Meats Protein - Limiting red meat: no more than a few times a month - When eating red meat: choose lean cuts and keep the portion to the size of deck of cards - Eggs: approx. 0 to 4 times a week  - Fish and lean poultry: at least 2 a week  - Healthy protein sources include, chicken, [place], lean beef, lamb - Increase intake of seafood such as tuna, salmon, trout, mackerel, shrimp, scallops - Avoid or limit high fat processed meats such as sausage and bacon  Dairy - Include moderate amounts of low fat dairy products  - Focus on healthy dairy such as fat free yogurt, skim milk, low or reduced fat cheese - Limit dairy products higher in fat such as whole or 2% milk, cheese, ice cream  Alcohol -  Moderate amounts of red wine is ok  - No more than 5 oz daily for women (all ages) and men older than age 32  - No more than 10 oz of wine daily for men younger than 81  Other - Limit sweets and other desserts  - Use herbs and spices instead of salt to flavor foods  - Herbs and spices common to the traditional Mediterranean Diet include: basil, bay leaves, chives, cloves, cumin, fennel, garlic, lavender, marjoram, mint, oregano, parsley, pepper, rosemary, sage, savory, sumac, tarragon, thyme   It's not just a diet, it's a lifestyle:    The Mediterranean diet includes lifestyle factors typical of those in the region    Foods, drinks and meals are best eaten with others and savored   Daily physical activity is important for overall good health   This could be  strenuous exercise like running and aerobics   This could also be more leisurely activities such as walking, housework, yard-work, or taking the stairs   Moderation is the key; a balanced and healthy diet accommodates most foods and drinks   Consider portion sizes and frequency of consumption of certain foods   Meal Ideas & Options:    Breakfast:  o Whole wheat toast or whole wheat English muffins with peanut butter & hard boiled egg o Steel cut oats topped with apples & cinnamon and skim milk  o Fresh fruit: banana, strawberries, melon, berries, peaches  o Smoothies: strawberries, bananas, greek yogurt, peanut butter o Low fat greek yogurt with blueberries and granola  o Egg white omelet with spinach and mushrooms o Breakfast couscous: whole wheat couscous, apricots, skim milk, cranberries    Sandwiches:  o Hummus and grilled vegetables (peppers, zucchini, squash) on whole wheat bread   o Grilled chicken on whole wheat pita with lettuce, tomatoes, cucumbers or tzatziki  o Tuna salad on whole wheat bread: tuna salad made with greek yogurt, olives, red peppers, capers, green onions o Garlic rosemary lamb pita: lamb sauted with garlic, rosemary, salt & pepper; add lettuce, cucumber, greek yogurt to pita - flavor with lemon juice and black pepper    Seafood:  o Mediterranean grilled salmon, seasoned with garlic, basil, parsley, lemon juice and black pepper o Shrimp, lemon, and spinach whole-grain pasta salad made with low fat greek yogurt  o Seared scallops with lemon orzo  o Seared tuna steaks seasoned salt, pepper, coriander topped with tomato mixture of olives, tomatoes, olive oil, minced garlic, parsley, green onions and cappers    Meats:  o Herbed greek chicken salad with kalamata olives, cucumber, feta  o Red bell peppers stuffed with spinach, bulgur, lean ground beef (or lentils) & topped with feta   o Kebabs: skewers of chicken, tomatoes, onions, zucchini, squash  o [place] burgers:  made with red onions, mint, dill, lemon juice, feta cheese topped with roasted red peppers   Vegetarian o Cucumber salad: cucumbers, artichoke hearts, celery, red onion, feta cheese, tossed in olive oil & lemon juice  o Hummus and whole grain pita points with a greek salad (lettuce, tomato, feta, olives, cucumbers, red onion) o Lentil soup with celery, carrots made with vegetable broth, garlic, salt and pepper  o Tabouli salad: parsley, bulgur, mint, scallions, cucumbers, tomato, radishes, lemon juice, olive oil, salt and pepper. o

## 2014-05-09 ENCOUNTER — Other Ambulatory Visit: Payer: Self-pay | Admitting: Family Medicine

## 2014-05-09 MED ORDER — DICLOFENAC SODIUM 75 MG PO TBEC: 75.0000 mg | DELAYED_RELEASE_TABLET | Freq: Two times a day (BID) | ORAL | Status: DC

## 2014-06-04 ENCOUNTER — Encounter: Payer: Self-pay | Admitting: Family Medicine

## 2014-10-25 ENCOUNTER — Other Ambulatory Visit: Payer: Self-pay | Admitting: Family Medicine

## 2014-10-26 ENCOUNTER — Encounter: Payer: Self-pay | Admitting: Family Medicine

## 2014-10-26 ENCOUNTER — Ambulatory Visit (INDEPENDENT_AMBULATORY_CARE_PROVIDER_SITE_OTHER): Payer: Medicare Other | Admitting: Family Medicine

## 2014-10-26 VITALS — BP 94/58 | HR 64 | Temp 98.3°F | Resp 16 | Ht 65.5 in | Wt 177.0 lb

## 2014-10-26 DIAGNOSIS — E7800 Pure hypercholesterolemia, unspecified: Secondary | ICD-10-CM

## 2014-10-26 DIAGNOSIS — Z833 Family history of diabetes mellitus: Secondary | ICD-10-CM

## 2014-10-26 LAB — LIPID PANEL
CHOLESTEROL: 190 mg/dL (ref 0–200)
HDL: 48 mg/dL (ref >=46)
LDL Cholesterol: 118 mg/dL — ABNORMAL HIGH (ref 0–99)
TRIGLYCERIDES: 118 mg/dL (ref <150)
Total CHOL/HDL Ratio: 4 Ratio
VLDL: 24 mg/dL (ref 0–40)

## 2014-10-26 LAB — THYROID PANEL WITH TSH
Free Thyroxine Index: 1.8 (ref 1.4–3.8)
T3 Uptake: 26 % (ref 22–35)
T4, Total: 7.1 ug/dL (ref 4.5–12.0)
TSH: 3.559 u[IU]/mL (ref 0.350–4.500)

## 2014-10-26 LAB — POCT GLYCOSYLATED HEMOGLOBIN (HGB A1C): HEMOGLOBIN A1C: 5.4

## 2014-10-26 MED ORDER — DICLOFENAC SODIUM 75 MG PO TBEC: 75.0000 mg | DELAYED_RELEASE_TABLET | Freq: Two times a day (BID) | ORAL | Status: DC

## 2014-10-26 NOTE — Patient Instructions (Signed)

## 2014-10-30 ENCOUNTER — Encounter: Payer: Self-pay | Admitting: Family Medicine

## 2014-10-30 NOTE — Progress Notes (Signed)
S:  This 74 y.o. Female is here for follow-up re: abnormal lipid profile. Oct 2015 values show persist cholesterol and triglycerides elevated. Pt has been reluctant to take prescription medication, trying to lower lipids with OTC supplements, better nutrition and exercise. She has gained weight since last visit.  Patient Active Problem List   Diagnosis Date Noted  . DDD (degenerative disc disease), lumbar 11/24/2013  . Difficulty urinating 05/09/2013  . Rectocele 12/22/2012  . Diverticulosis of sigmoid colon 08/24/2012  . Seborrheic keratosis 06/27/2012  . Hypercholesteremia 04/21/2012  . Scoliosis of lumbar spine 04/21/2012  . Overweight (BMI 25.0-29.9) 04/21/2012    Prior to Admission medications   Medication Sig Start Date End Date Taking? Authorizing Provider  diclofenac (VOLTAREN) 75 MG EC tablet Take 1 tablet (75 mg total) by mouth 2 (two) times daily.    Barton Fanny, MD  fish oil-omega-3 fatty acids 1000 MG capsule Take 2 g by mouth daily.    Historical Provider, MD  IBUPROFEN PO Take by mouth as needed.    Historical Provider, MD  MELATONIN PO Take by mouth as needed.    Historical Provider, MD  Multiple Vitamins-Minerals (MULTIVITAMIN PO) Take by mouth daily.    Historical Provider, MD  TURMERIC PO Take by mouth. CAPSULE    Historical Provider, MD    Past Surgical History  Procedure Laterality Date  . Cholecystectomy  2005  . Cesarean section  1984  . Cystectomy  2003  . Uterine cyst removal  2005  . Hysteroscopic resection      hysteroscopic resection of polyp  . Lithotripsy  04/2013    --Dr. Janice Norrie    [facility] and [facility] HX reviewed.  ROS: Noncontributory.  O: Filed Vitals:   10/26/14 1326  BP: 94/58  Pulse: 64  Temp: 98.3 F (36.8 C)  Resp: 16    GEN: In NAD; WN/WD. HENT: [place]/AT. EOMI w/ clear conj/sclerae. Otherwise unremarkable. COR: RRR. LUNGS: Normal resp rate and effort. SKIN; W&D.  NEURO: A&O x 3; CNs intact. Nonfocal.  A1c=  5.4%  A/P: Hypercholesteremia - Plan: Thyroid Panel With TSH, Lipid panel  Family history of diabetes mellitus in mother - Plan: POCT glycosylated hemoglobin (Hb A1C)

## 2014-11-01 ENCOUNTER — Ambulatory Visit (INDEPENDENT_AMBULATORY_CARE_PROVIDER_SITE_OTHER): Payer: Medicare Other

## 2014-11-01 ENCOUNTER — Ambulatory Visit (INDEPENDENT_AMBULATORY_CARE_PROVIDER_SITE_OTHER): Payer: Medicare Other | Admitting: Family Medicine

## 2014-11-01 VITALS — BP 118/70 | HR 68 | Temp 98.2°F | Resp 18 | Ht 65.5 in | Wt 178.6 lb

## 2014-11-01 DIAGNOSIS — R05 Cough: Secondary | ICD-10-CM

## 2014-11-01 DIAGNOSIS — R062 Wheezing: Secondary | ICD-10-CM

## 2014-11-01 DIAGNOSIS — R059 Cough, unspecified: Secondary | ICD-10-CM

## 2014-11-01 DIAGNOSIS — J209 Acute bronchitis, unspecified: Secondary | ICD-10-CM

## 2014-11-01 MED ORDER — IPRATROPIUM BROMIDE 0.02 % IN SOLN
0.5000 mg | Freq: Once | RESPIRATORY_TRACT | Status: AC
  Administered 2014-11-01: 0.5 mg via RESPIRATORY_TRACT

## 2014-11-01 MED ORDER — ALBUTEROL SULFATE HFA 108 (90 BASE) MCG/ACT IN AERS: 2.0000 | INHALATION_SPRAY | Freq: Four times a day (QID) | RESPIRATORY_TRACT | Status: DC | PRN

## 2014-11-01 MED ORDER — HYDROCODONE-HOMATROPINE 5-1.5 MG/5ML PO SYRP: 5.0000 mL | ORAL_SOLUTION | Freq: Every evening | ORAL | Status: DC | PRN

## 2014-11-01 MED ORDER — AZITHROMYCIN 250 MG PO TABS: ORAL_TABLET | ORAL | Status: DC

## 2014-11-01 MED ORDER — ALBUTEROL SULFATE (2.5 MG/3ML) 0.083% IN NEBU
2.5000 mg | INHALATION_SOLUTION | Freq: Once | RESPIRATORY_TRACT | Status: AC
  Administered 2014-11-01: 2.5 mg via RESPIRATORY_TRACT

## 2014-11-01 NOTE — Progress Notes (Signed)
Chief Complaint:  Chief Complaint  Patient presents with  . Cough    x 1 week    HPI: Cynthia Castro is a 74 y.o. female who is here for  one-week history of cough, chills. Denies fevers. He has had her flu vaccine this season. Denies muscle aches or pain. She feels like it's with an upper respiratory cold and it has now lodged in her throat. She has had difficulty sleeping flat. She has to cough.  She denies any weight gain or leg swelling. There is no history of congestive heart failure. She denies any wheezing or shortness of breath with exertion. Has tried over-the-counter medicines without relief. She has arthritis  Past Medical History  Diagnosis Date  . Arthritis   . Hyperlipidemia   . DDD (degenerative disc disease), cervical   . History of kidney stones 05/2013   Past Surgical History  Procedure Laterality Date  . Cholecystectomy  2005  . Cesarean section  1984  . Cystectomy  2003  . Uterine cyst removal  2005  . Hysteroscopic resection      hysteroscopic resection of polyp  . Lithotripsy  04/2013    --Dr. Janice Norrie   History   Social History  . Marital Status: Divorced    Spouse Name: N/A  . Number of Children: N/A  . Years of Education: N/A   Occupational History  . 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 Smoking status: Never Smoker   . Smokeless tobacco: Never Used  . Alcohol Use: 1.2 oz/week    2 Glasses of wine per week     Comment: WINE OR BEER - 1 OR 2 A WEEK  . Drug Use: No  . Sexual Activity: No   Other Topics Concern  . None   Social History Narrative   Divorced. Education: [employer]. Exercise: Walk/Yoga 3 times a week for 30 minutes.   Family History  Problem Relation Age of Onset  . Diabetes Mother   . Heart disease Mother     CHF  . Vision loss Mother   . Cancer Father     TESTICULAR  . Stroke Father 64  . Hyperlipidemia Father   . Hyperlipidemia Maternal Grandmother   . Heart disease Maternal Grandfather     . Diabetes Maternal Grandfather   . Anxiety disorder Brother   . Seizures Brother   . Cancer Paternal Grandmother   . Cancer Paternal Grandfather    No Known Allergies Prior to Admission medications   Medication Sig Start Date End Date Taking? Authorizing Provider  diclofenac (VOLTAREN) 75 MG EC tablet Take 1 tablet (75 mg total) by mouth 2 (two) times daily. 10/26/14  Yes Barton Fanny, MD  fish oil-omega-3 fatty acids 1000 MG capsule Take 2 g by mouth daily.   Yes Historical Provider, MD  IBUPROFEN PO Take by mouth as needed.   Yes Historical Provider, MD  MELATONIN PO Take by mouth as needed.   Yes Historical Provider, MD  Multiple Vitamins-Minerals (MULTIVITAMIN PO) Take by mouth daily.   Yes Historical Provider, MD  TURMERIC PO Take by mouth. CAPSULE   Yes Historical Provider, MD     ROS: The patient denies fevers, chills, night sweats, unintentional weight loss, chest pain, palpitations, wheezing, dyspnea on exertion, nausea, vomiting, abdominal pain, dysuria, hematuria, melena, numbness, weakness, or tingling.   All other systems have been reviewed and were otherwise negative with the exception of those mentioned in  the HPI and as above.    PHYSICAL EXAM: Filed Vitals:   11/01/14 0811  BP: 118/70  Pulse: 68  Temp: 98.2 F (36.8 C)  Resp: 18   Filed Vitals:   11/01/14 0811  Height: 5' 5.5" (1.664 m)  Weight: 178 lb 9.6 oz (81.012 kg)   Body mass index is 29.26 kg/(m^2).  General: Alert, no acute distress HEENT:  Normocephalic, atraumatic, oropharynx patent. EOMI, PERRLA, TMs normal, erythematous throat Cardiovascular:  Regular rate and rhythm, no rubs murmurs or gallops.  No Carotid bruits, radial pulse intact. No pedal edema.  Respiratory: + diffuse  rhonchi.  No cyanosis, no use of accessory musculature GI: No organomegaly, abdomen is soft and non-tender, positive bowel sounds.  No masses. Skin: No rashes. Neurologic: Facial musculature  symmetric. Psychiatric: Patient is appropriate throughout our interaction. Lymphatic: No cervical lymphadenopathy Musculoskeletal: Gait intact.   LABS: Results for orders placed or performed in visit on 10/26/14  Thyroid Panel With TSH  Result Value Ref Range   T4, Total 7.1 4.5 - 12.0 ug/dL   T3 Uptake 26 22 - 35 %   Free Thyroxine Index 1.8 1.4 - 3.8   TSH 3.559 0.350 - 4.500 uIU/mL  Lipid panel  Result Value Ref Range   Cholesterol 190 0 - 200 mg/dL   Triglycerides 118 <150 mg/dL   HDL 48 >=46 mg/dL   Total CHOL/HDL Ratio 4.0 Ratio   VLDL 24 0 - 40 mg/dL   LDL Cholesterol 118 (H) 0 - 99 mg/dL  POCT glycosylated hemoglobin (Hb A1C)  Result Value Ref Range   Hemoglobin A1C 5.4      EKG/XRAY:   Primary read interpreted by [facility]. Marin Comment at [facility]. Increase vascular markings Bronchitis changes No pneumo, no effusion    ASSESSMENT/PLAN: Encounter Diagnoses  Name Primary?  . Cough Yes  . Wheeze   . Acute bronchitis, unspecified organism    This is a 74 year old female with one-week history of upper respiratory and lower respiratory symptoms. I will go ahead and presumptively treat her for bronchitis and pneumonia with a Z-Pak. She is planning to go to [place] soon. Talked about putting her on Levaquin but since she already has DJD and arthritis the Levaquin might make her joint pain worse. She would rather be on azithromycin. She did somewhat better with a neb treatment. I will go ahead and prescribe her an albuterol inhaler. She will also get medicines for cough including over-the-counter Mucinex without the DM and Hycodan. Follow-up as needed. I will follow-up with her with official chest x-ray results to see if she needs a follow-up x-ray.   Gross sideeffects, risk and benefits, and alternatives of medications d/w patient. Patient is aware that all medications have potential sideeffects and we are unable to predict every sideeffect or drug-drug interaction that may occur.  LE,  [place], DO 11/01/2014 12:26 PM

## 2014-11-01 NOTE — Patient Instructions (Signed)

## 2014-11-02 ENCOUNTER — Encounter: Payer: Self-pay | Admitting: Family Medicine

## 2015-01-02 ENCOUNTER — Encounter: Payer: Self-pay | Admitting: Obstetrics and Gynecology

## 2015-01-02 ENCOUNTER — Ambulatory Visit (INDEPENDENT_AMBULATORY_CARE_PROVIDER_SITE_OTHER): Payer: Medicare Other | Admitting: Obstetrics and Gynecology

## 2015-01-02 ENCOUNTER — Ambulatory Visit: Payer: Medicare Other | Admitting: Obstetrics and Gynecology

## 2015-01-02 VITALS — BP 94/58 | HR 80 | Resp 16 | Ht 65.0 in | Wt 174.0 lb

## 2015-01-02 DIAGNOSIS — N816 Rectocele: Secondary | ICD-10-CM

## 2015-01-02 DIAGNOSIS — Z01419 Encounter for gynecological examination (general) (routine) without abnormal findings: Secondary | ICD-10-CM

## 2015-01-02 NOTE — Progress Notes (Signed)
Patient ID: Cynthia Castro, female   DOB: [birth year], 74 y.o.   MRN: [id number] 74 y.o. G4P3 Divorced Caucasian female here for annual exam.    Abdominal discomfort resolved with a stool softener.  States this changed her life.  No problems with pelvic organ prolapse.  PCP:   Dr [name]  Patient's last menstrual period was 08/03/1998.          Sexually active: No.  The current method of family planning is post menopausal status.    Exercising: Yes.    Home exercise routine includes water aerobics, walking and yoga. Smoker:  no  Health Maintenance: Pap:  10-31-09 WNL NEG HR HPV History of abnormal Pap:  no MMG:  02-06-14 fibroglandular density [facility] Colonoscopy:  2013 WNL Dr. Michail Sermon @ [facility] BMD:   10/2006 [facility] TDaP:  12-22-2012 Screening Labs:  PCP    reports that she has never smoked. She has never used smokeless tobacco. She reports that she drinks about 12.6 oz of alcohol per week. She reports that she does not use illicit drugs.  Past Medical History  Diagnosis Date  . Arthritis   . Hyperlipidemia   . DDD (degenerative disc disease), cervical   . History of kidney stones 05/2013    Past Surgical History  Procedure Laterality Date  . Cholecystectomy  2005  . Cesarean section  1984  . Cystectomy  2003  . Uterine cyst removal  2005  . Hysteroscopic resection      hysteroscopic resection of polyp  . Lithotripsy  04/2013    --Dr. Janice Norrie    Current Outpatient Prescriptions  Medication Sig Dispense Refill  . diclofenac (VOLTAREN) 75 MG EC tablet Take 1 tablet (75 mg total) by mouth 2 (two) times daily. 60 tablet 2  . fish oil-omega-3 fatty acids 1000 MG capsule Take 2 g by mouth daily.    . IBUPROFEN PO Take by mouth as needed.    Marland Kitchen MELATONIN PO Take by mouth as needed.    . Multiple Vitamins-Minerals (MULTIVITAMIN PO) Take by mouth daily.    . TURMERIC PO Take by mouth. CAPSULE     No current facility-administered medications for  this visit.    Family History  Problem Relation Age of Onset  . Diabetes Mother   . Heart disease Mother     CHF  . Vision loss Mother   . Cancer Father     TESTICULAR  . Stroke Father 46  . Hyperlipidemia Father   . Hyperlipidemia Maternal Grandmother   . Heart disease Maternal Grandfather   . Diabetes Maternal Grandfather   . Anxiety disorder Brother   . Seizures Brother   . Cancer Paternal Grandmother   . Cancer Paternal Grandfather     ROS:  Pertinent items are noted in HPI.  Otherwise, a comprehensive ROS was negative.  Exam:   BP 94/58 mmHg  Pulse 80  Resp 16  Ht 5\' 5"  (1.651 m)  Wt 174 lb (78.926 kg)  BMI 28.96 kg/m2  LMP 08/03/1998    General appearance: alert, cooperative and appears stated age Head: Normocephalic, without obvious abnormality, atraumatic Neck: no adenopathy, supple, symmetrical, trachea midline and thyroid normal to inspection and palpation Lungs: clear to auscultation bilaterally Breasts: normal appearance, no masses or tenderness, Inspection negative, No nipple retraction or dimpling, No nipple discharge or bleeding, No axillary or supraclavicular adenopathy Heart: regular rate and rhythm Abdomen: soft, non-tender; bowel sounds normal; no masses,  no  organomegaly Extremities: extremities normal, atraumatic, no cyanosis or edema Skin: Skin color, texture, turgor normal. No rashes or lesions Lymph nodes: Cervical, supraclavicular, and axillary nodes normal. No abnormal inguinal nodes palpated Neurologic: Grossly normal  Pelvic: External genitalia:  no lesions              Urethra:  normal appearing urethra with no masses, tenderness or lesions              Bartholins and Skenes: normal                 Vagina: normal appearing vagina with normal color and discharge, no lesions.  Minimal cystocele. Second degree rectocele.  No uterine prolapse.              Cervix: no lesions              Pap taken: No. Bimanual Exam:  Uterus:  normal size,  contour, position, consistency, mobility, non-tender              Adnexa: normal adnexa and no mass, fullness, tenderness              Rectovaginal: Yes.  .  Confirms.              Anus:  normal sphincter tone, no lesions  Chaperone was present for exam.  Assessment:   Well woman visit with normal exam. Rectocele.  Asymptomatic.  Plan: Yearly mammogram recommended after age 2.  Recommended self breast exam.  Pap and HR HPV as above. Discussed Calcium, Vitamin D, regular exercise program including cardiovascular and weight bearing exercise. Labs performed.  No..     Refills given on medications.  No..    Follow up annually and prn.      After visit summary provided.

## 2015-01-02 NOTE — Patient Instructions (Signed)

## 2015-01-28 ENCOUNTER — Other Ambulatory Visit: Payer: Self-pay

## 2015-03-04 ENCOUNTER — Ambulatory Visit (INDEPENDENT_AMBULATORY_CARE_PROVIDER_SITE_OTHER): Payer: Medicare Other | Admitting: Family Medicine

## 2015-03-04 VITALS — BP 106/68 | HR 62 | Temp 98.0°F | Resp 18 | Ht 65.75 in | Wt 175.2 lb

## 2015-03-04 DIAGNOSIS — J209 Acute bronchitis, unspecified: Secondary | ICD-10-CM | POA: Diagnosis not present

## 2015-03-04 DIAGNOSIS — R05 Cough: Secondary | ICD-10-CM

## 2015-03-04 DIAGNOSIS — R059 Cough, unspecified: Secondary | ICD-10-CM

## 2015-03-04 MED ORDER — AMOXICILLIN-POT CLAVULANATE 875-125 MG PO TABS: 1.0000 | ORAL_TABLET | Freq: Two times a day (BID) | ORAL | Status: DC

## 2015-03-04 MED ORDER — BENZONATATE 100 MG PO CAPS: 200.0000 mg | ORAL_CAPSULE | Freq: Two times a day (BID) | ORAL | Status: DC | PRN

## 2015-03-04 MED ORDER — HYDROCODONE-HOMATROPINE 5-1.5 MG/5ML PO SYRP: 5.0000 mL | ORAL_SOLUTION | Freq: Every evening | ORAL | Status: DC | PRN

## 2015-03-04 NOTE — Patient Instructions (Signed)
Acute Bronchitis °Bronchitis is inflammation of the airways that extend from the windpipe into the lungs (bronchi). The inflammation often causes mucus to develop. This leads to a cough, which is the most common symptom of bronchitis.  °In acute bronchitis, the condition usually develops suddenly and goes away over time, usually in a couple weeks. Smoking, allergies, and asthma can make bronchitis worse. Repeated episodes of bronchitis may cause further lung problems.  °CAUSES °Acute bronchitis is most often caused by the same virus that causes a cold. The virus can spread from person to person (contagious) through coughing, sneezing, and touching contaminated objects. °SIGNS AND SYMPTOMS  °· Cough.   °· Fever.   °· Coughing up mucus.   °· Body aches.   °· Chest congestion.   °· Chills.   °· Shortness of breath.   °· Sore throat.   °DIAGNOSIS  °Acute bronchitis is usually diagnosed through a physical exam. Your health care provider will also ask you questions about your medical history. Tests, such as chest X-rays, are sometimes done to rule out other conditions.  °TREATMENT  °Acute bronchitis usually goes away in a couple weeks. Oftentimes, no medical treatment is necessary. Medicines are sometimes given for relief of fever or cough. Antibiotic medicines are usually not needed but may be prescribed in certain situations. In some cases, an inhaler may be recommended to help reduce shortness of breath and control the cough. A cool mist vaporizer may also be used to help thin bronchial secretions and make it easier to clear the chest.  °HOME CARE INSTRUCTIONS °· Get plenty of rest.   °· Drink enough fluids to keep your urine clear or pale yellow (unless you have a medical condition that requires fluid restriction). Increasing fluids may help thin your respiratory secretions (sputum) and reduce chest congestion, and it will prevent dehydration.   °· Take medicines only as directed by your health care provider. °· If  you were prescribed an antibiotic medicine, finish it all even if you start to feel better. °· Avoid smoking and secondhand smoke. Exposure to cigarette smoke or irritating chemicals will make bronchitis worse. If you are a smoker, consider using nicotine gum or skin patches to help control withdrawal symptoms. Quitting smoking will help your lungs heal faster.   °· Reduce the chances of another bout of acute bronchitis by washing your hands frequently, avoiding people with cold symptoms, and trying not to touch your hands to your mouth, nose, or eyes.   °· Keep all follow-up visits as directed by your health care provider.   °SEEK MEDICAL CARE IF: °Your symptoms do not improve after 1 week of treatment.  °SEEK IMMEDIATE MEDICAL CARE IF: °· You develop an increased fever or chills.   °· You have chest pain.   °· You have severe shortness of breath. °· You have bloody sputum.   °· You develop dehydration. °· You faint or repeatedly feel like you are going to pass out. °· You develop repeated vomiting. °· You develop a severe headache. °MAKE SURE YOU:  °· Understand these instructions. °· Will watch your condition. °· Will get help right away if you are not doing well or get worse. °Document Released: 08/27/2004 Document Revised: 12/04/2013 Document Reviewed: 01/10/2013 °Exit[employer]® Patient Information ©2015 [employer]. This information is not intended to replace advice given to you by your health care provider. Make sure you discuss any questions you have with your health care provider. ° °

## 2015-03-04 NOTE — Progress Notes (Signed)
 Chief Complaint:  Chief Complaint  Patient presents with  . Cough    x2 weeks, productive cough with yellow mucus     HPI: Cynthia Castro is a 74 y.o. female who reports to [facility] today complaining of earache history of productive cough, slightly yellow, no fevers or chills. She's been taking over-the-counter cough medicines. She has no shortness of breath chest pain or wheezing. She does not if she has allergies or asthma. She's never had this in the past. She's had this for about 5 months off and on. The last time I saw her was right before she went to [place]. She was fine in [place] and then when she came back she had similar symptoms. She denies reflux symptoms. She has no sinus tenderness, ear pain, nasal drip which. She denies any hemoptysis, night sweats or unintentional weight loss.  Past Medical History  Diagnosis Date  . Arthritis   . Hyperlipidemia   . DDD (degenerative disc disease), cervical   . History of kidney stones 05/2013   Past Surgical History  Procedure Laterality Date  . Cholecystectomy  2005  . Cesarean section  1984  . Cystectomy  2003  . Uterine cyst removal  2005  . Hysteroscopic resection      hysteroscopic resection of polyp  . Lithotripsy  04/2013    --Dr. Janice Norrie   History   Social History  . Marital Status: Divorced    Spouse Name: N/A  . Number of Children: N/A  . Years of Education: N/A   Occupational History  . 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 Smoking status: Never Smoker   . Smokeless tobacco: Never Used  . Alcohol Use: 12.6 oz/week    21 Glasses of wine per week     Comment: WINE OR BEER - 1 OR 2 A WEEK  . Drug Use: No  . Sexual Activity: No   Other Topics Concern  . None   Social History Narrative   Divorced. Education: [employer]. Exercise: Walk/Yoga 3 times a week for 30 minutes.   Family History  Problem Relation Age of Onset  . Diabetes Mother   . Heart disease Mother     CHF  . Vision loss  Mother   . Cancer Father     TESTICULAR  . Stroke Father 13  . Hyperlipidemia Father   . Hyperlipidemia Maternal Grandmother   . Heart disease Maternal Grandfather   . Diabetes Maternal Grandfather   . Anxiety disorder Brother   . Seizures Brother   . Cancer Paternal Grandmother   . Cancer Paternal Grandfather    No Known Allergies Prior to Admission medications   Medication Sig Start Date End Date Taking? Authorizing Provider  diclofenac (VOLTAREN) 75 MG EC tablet Take 1 tablet (75 mg total) by mouth 2 (two) times daily. 10/26/14  Yes Barton Fanny, MD  IBUPROFEN PO Take by mouth as needed.   Yes Historical Provider, MD  MELATONIN PO Take by mouth as needed.   Yes Historical Provider, MD  amoxicillin-clavulanate (AUGMENTIN) 875-125 MG per tablet Take 1 tablet by mouth 2 (two) times daily. 03/04/15    P , DO  benzonatate (TESSALON) 100 MG capsule Take 2 capsules (200 mg total) by mouth 2 (two) times daily as needed. 03/04/15    P , DO  fish oil-omega-3 fatty acids 1000 MG capsule Take 2 g by mouth daily.    Historical Provider, MD  HYDROcodone-homatropine (  HYCODAN) 5-1.5 MG/5ML syrup Take 5 mLs by mouth at bedtime as needed. 03/04/15    P , DO  Multiple Vitamins-Minerals (MULTIVITAMIN PO) Take by mouth daily.    Historical Provider, MD  TURMERIC PO Take by mouth. CAPSULE    Historical Provider, MD     ROS: The patient denies fevers, chills, night sweats, unintentional weight loss, chest pain, palpitations, wheezing, dyspnea on exertion, nausea, vomiting, abdominal pain, dysuria, hematuria, melena, numbness, weakness, or tingling.   All other systems have been reviewed and were otherwise negative with the exception of those mentioned in the HPI and as above.    PHYSICAL EXAM: Filed Vitals:   03/04/15 0848  BP: 106/68  Pulse: 62  Temp: 98 F (36.7 C)  Resp: 18   Body mass index is 28.5 kg/(m^2).  SpO2 Readings from Last 3 Encounters:  03/04/15 96%  11/01/14  94%  10/26/14 93%    General: Alert, no acute distress HEENT:  Normocephalic, atraumatic, oropharynx patent. EOMI, PERRLA Nontender sinuses, tympanic membranes normal, throat is unremarkable Cardiovascular:  Regular rate and rhythm, no rubs murmurs or gallops.  No pedal edema.  Respiratory: Clear to auscultation bilaterally.  No wheezes, rales, or rhonchi.  No cyanosis, no use of accessory musculature Abdominal: No organomegaly, abdomen is soft and non-tender, positive bowel sounds. No masses. Skin: No rashes. Neurologic: Facial musculature symmetric. Psychiatric: Patient acts appropriately throughout our interaction. Lymphatic: No cervical lymphadenopathy Musculoskeletal: Gait intact. No edema, tenderness   LABS: Results for orders placed or performed in visit on 10/26/14  Thyroid Panel With TSH  Result Value Ref Range   T4, Total 7.1 4.5 - 12.0 ug/dL   T3 Uptake 26 22 - 35 %   Free Thyroxine Index 1.8 1.4 - 3.8   TSH 3.559 0.350 - 4.500 uIU/mL  Lipid panel  Result Value Ref Range   Cholesterol 190 0 - 200 mg/dL   Triglycerides 118 <150 mg/dL   HDL 48 >=46 mg/dL   Total CHOL/HDL Ratio 4.0 Ratio   VLDL 24 0 - 40 mg/dL   LDL Cholesterol 118 (H) 0 - 99 mg/dL  POCT glycosylated hemoglobin (Hb A1C)  Result Value Ref Range   Hemoglobin A1C 5.4      EKG/XRAY:   Primary read interpreted by Dr. Marin Comment at [facility]   ASSESSMENT/PLAN: Encounter Diagnoses  Name Primary?  . Acute bronchitis, unspecified organism Yes  . Cough    She will try symptomatic treatment with Tessalon Perles and Hycodan. Her vital signs are stable and her physical exam was unremarkable respiratory wise. We will defer a chest x-ray, she worsens. Patient is agreeable to this. She was also given a prescription for Augmentin, she is to take this if she worsens in the next 4-5 days. In [place] allergy testing if this continues, advised to change filters and look for allergens in the house.  Gross sideeffects, risk  and benefits, and alternatives of medications d/w patient. Patient is aware that all medications have potential sideeffects and we are unable to predict every sideeffect or drug-drug interaction that may occur.    DO  03/04/2015 9:17 AM

## 2015-05-09 ENCOUNTER — Ambulatory Visit (INDEPENDENT_AMBULATORY_CARE_PROVIDER_SITE_OTHER): Payer: Medicare Other | Admitting: Family Medicine

## 2015-05-09 ENCOUNTER — Encounter: Payer: Self-pay | Admitting: Family Medicine

## 2015-05-09 VITALS — BP 100/66 | HR 66 | Temp 97.6°F | Resp 16 | Ht 66.0 in | Wt 175.2 lb

## 2015-05-09 DIAGNOSIS — E663 Overweight: Secondary | ICD-10-CM

## 2015-05-09 DIAGNOSIS — Z0000 Encounter for general adult medical examination without abnormal findings: Secondary | ICD-10-CM

## 2015-05-09 DIAGNOSIS — Z23 Encounter for immunization: Secondary | ICD-10-CM

## 2015-05-09 DIAGNOSIS — E7800 Pure hypercholesterolemia, unspecified: Secondary | ICD-10-CM

## 2015-05-09 DIAGNOSIS — Z1231 Encounter for screening mammogram for malignant neoplasm of breast: Secondary | ICD-10-CM

## 2015-05-09 DIAGNOSIS — Z130 Encounter for screening for diseases of the blood and blood-forming organs and certain disorders involving the immune mechanism: Secondary | ICD-10-CM

## 2015-05-09 DIAGNOSIS — R39198 Other difficulties with micturition: Secondary | ICD-10-CM | POA: Diagnosis not present

## 2015-05-09 LAB — COMPREHENSIVE METABOLIC PANEL
ALK PHOS: 83 U/L (ref 33–130)
ALT: 18 U/L (ref 6–29)
AST: 19 U/L (ref 10–35)
Albumin: 4.2 g/dL (ref 3.6–5.1)
BUN: 19 mg/dL (ref 7–25)
CO2: 28 mmol/L (ref 20–31)
Calcium: 9.4 mg/dL (ref 8.6–10.4)
Chloride: 106 mmol/L (ref 98–110)
Creat: 0.78 mg/dL (ref 0.60–0.93)
Glucose, Bld: 81 mg/dL (ref 65–99)
POTASSIUM: 4.6 mmol/L (ref 3.5–5.3)
Sodium: 141 mmol/L (ref 135–146)
TOTAL PROTEIN: 6.9 g/dL (ref 6.1–8.1)
Total Bilirubin: 0.9 mg/dL (ref 0.2–1.2)

## 2015-05-09 LAB — POCT URINALYSIS DIP (MANUAL ENTRY)
BILIRUBIN UA: NEGATIVE
Bilirubin, UA: NEGATIVE
Blood, UA: NEGATIVE
Glucose, UA: NEGATIVE
LEUKOCYTES UA: NEGATIVE
Nitrite, UA: NEGATIVE
PH UA: 5.5
PROTEIN UA: NEGATIVE
SPEC GRAV UA: 1.025
Urobilinogen, UA: 0.2

## 2015-05-09 LAB — CBC
HCT: 44.8 % (ref 36.0–46.0)
Hemoglobin: 14.9 g/dL (ref 12.0–15.0)
MCH: 29 pg (ref 26.0–34.0)
MCHC: 33.3 g/dL (ref 30.0–36.0)
MCV: 87.3 fL (ref 78.0–100.0)
MPV: 9.8 fL (ref 8.6–12.4)
PLATELETS: 263 10*3/uL (ref 150–400)
RBC: 5.13 MIL/uL — AB (ref 3.87–5.11)
RDW: 13.9 % (ref 11.5–15.5)
WBC: 5.8 10*3/uL (ref 4.0–10.5)

## 2015-05-09 LAB — LIPID PANEL
Cholesterol: 249 mg/dL — ABNORMAL HIGH (ref 125–200)
HDL: 49 mg/dL
LDL Cholesterol: 169 mg/dL — ABNORMAL HIGH
Total CHOL/HDL Ratio: 5.1 ratio — ABNORMAL HIGH
Triglycerides: 154 mg/dL — ABNORMAL HIGH
VLDL: 31 mg/dL — ABNORMAL HIGH

## 2015-05-09 MED ORDER — DICLOFENAC SODIUM 75 MG PO TBEC: 75.0000 mg | DELAYED_RELEASE_TABLET | Freq: Two times a day (BID) | ORAL | Status: DC | PRN

## 2015-05-09 NOTE — Progress Notes (Signed)
Subjective:    Patient ID: Cynthia Castro, female    DOB: 1941-03-31, 74 y.o.   MRN: [id number]  HPI    Review of Systems  Constitutional: Negative.   HENT: Negative.   Eyes: Negative.   Respiratory: Negative.   Gastrointestinal: Negative.   Endocrine: Positive for polyphagia.  Genitourinary: Negative.   Musculoskeletal: Negative.   Skin: Negative.   Allergic/Immunologic: Negative.   Neurological: Negative.   Hematological: Negative.   Psychiatric/Behavioral: Negative.        Objective:   Physical Exam        Assessment & Plan:     Subjective:    Patient ID: Cynthia Castro, female    DOB: 03/15/1941, 74 y.o.   MRN: [id number]  HPI  This 74 y.o. Cauc female is here for [facility] Subsequent Wellness exam. Pt practices healthy lifestyle w/ nutrition, yoga, walking and aquatic exercise [facility] once [facility] week.  rigding bick  HCM: MMG- Current. Done on 01/2014, Order placed today but it is done at her gyn Dr. Josefa Half - 1 small fibroid           [place] (2013- recall at 10 years). Had + IFOBT 11/24/13 and 12/04/2013 here so referred to GI           IMM-Curent. Zostavax done 2015, tdap 12/2012, annual flu shot, pneumo 23 2008 - so needs prevnar 13           Vision- Current- (last week w/ Dr. Katy Fitch).           DEXA- Pt declines ; last 2 studies have been normal. In 5/09, 3/08, 6/04 - weight bearing exercise No sig FHx other htan paternal gm at old ago so would like to go to every other yr on mammograms.  Other providers Dr. Quincy Simmonds - gyn - has appt 01/2016 Dr. Janice Norrie - urology - kidney stone  Dr. Michail Sermon - GI  Dr. Katy Fitch - optho - perfect s Dr. Bevelyn Buckles  - dentist  Kidney stone - lat hr - none prior 5 mos prior a1c nml at 5.4, thyroid panel nml, cbc nml 2 yrs ago, vit D nml at 63 2 yrs prior.  Drinking 1/2 gallon of milk every wk but no exogenous ca/vit D. Working on tlc and chol improved sig t chol 223 ->190 6 mos prior with trig 220->118, hdl 50->48, LDL 129 ->118 - hasn't  been working on her diet and much - eats a lot of different foods she has cravings for - has stopped fish oil  Diclofenac works Transport planner well - takes prn  gerd - a lot of pain in LUQ intermittently - helps wyhen she drinks a lot of water - LUq feels like a tight bump - she feels she does better watching her posture.  Not getting worse - int - none in 3 wks. Happens with the gastritis foods but alos happens at toher timnes, sometimes wakes her up and she takes some peptobismol in the on w/ relief, uncahn aged for years. Had that + FOBT prior and pt thinksd she had it followed up on but is not sure.  Noticing a little more trouble with word recall but no others. Has reviewed hoem for safety, safe at home, no falls,  No balance, good light.  Has a handle in the tub - she is very careful.  Has started to ride bike. No stairs or toileting probms.  No concerns about hearing.  Does have end-of-life planning done.  Does not want to be a vegetable. Would want to try to resescutate her now but if things were looking bad   Depression screen [facility] 2/9 05/09/2015 03/04/2015 05/08/2014 11/24/2013 05/05/2013  Decreased Interest 0 0 0 0 1  Down, Depressed, Hopeless 0 0 0 0 0  PHQ - 2 Score 0 0 0 0 1     Patient Active Problem List   Diagnosis Date Noted  . DDD (degenerative disc disease), lumbar 11/24/2013  . Difficulty urinating 05/09/2013  . Rectocele 12/22/2012  . Diverticulosis of sigmoid colon 08/24/2012  . Seborrheic keratosis 06/27/2012  . Hypercholesteremia 04/21/2012  . Scoliosis of lumbar spine 04/21/2012  . Overweight (BMI 25.0-29.9) 04/21/2012    Prior to Admission medications   Medication Sig Start Date End Date Taking? Authorizing Provider  augmented betamethasone dipropionate (DIPROLENE AF) 0.05 % cream Apply topically 2 (two) times daily. 03/28/14  Yes Darlyne Russian, MD  diclofenac (VOLTAREN) 75 MG EC tablet Take 1 tablet (75 mg total) by mouth 2 (two) times daily. Take as needed with food.  05/05/13  Yes Barton Fanny, MD  fish oil-omega-3 fatty acids 1000 MG capsule Take 2 g by mouth daily.   Yes Historical Provider, MD  IBUPROFEN PO Take by mouth as needed.   Yes Historical Provider, MD  MELATONIN PO Take by mouth as needed.   Yes Historical Provider, MD  Multiple Vitamins-Minerals (MULTIVITAMIN PO) Take by mouth daily.   Yes Historical Provider, MD  TURMERIC PO Take by mouth. CAPSULE   Yes Historical Provider, MD   Social History   Social History  . Marital Status: Divorced    Spouse Name: N/A  . Number of Children: N/A  . Years of Education: N/A   Occupational History  . 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 Smoking status: Never Smoker   . Smokeless tobacco: Never Used  . Alcohol Use: 0.6 oz/week    1 Glasses of wine per week     Comment: WINE OR BEER - 1 OR 2 A WEEK  . Drug Use: No  . Sexual Activity: No   Other Topics Concern  . Not on file   Social History Narrative   Divorced. Education: [employer]. Exercise: Walk/Yoga 3 times a week for 30 minutes.    Family History  Problem Relation Age of Onset  . Diabetes Mother   . Heart disease Mother     CHF  . Vision loss Mother   . Cancer Father     TESTICULAR  . Stroke Father 37  . Hyperlipidemia Father   . Hyperlipidemia Maternal Grandmother   . Heart disease Maternal Grandfather   . Diabetes Maternal Grandfather   . Anxiety disorder Brother   . Seizures Brother   . Cancer Paternal Grandmother   . Cancer Paternal Grandfather      Review of Systems  Constitutional: Negative.   HENT: Negative.   Eyes: Negative.   Respiratory: Negative.   Cardiovascular: Negative.   Gastrointestinal: Negative.   Endocrine: Positive for polyphagia.       Pt admits "emotional" eating- eats when bored, happy or sad >> feels better.  Genitourinary: Negative.        C/o urinary frequency and nocturia but this is relieved when she has normal BMs; takes Colace which helps pelvic discomfort. Pt  states this problem has been evaluated by GYN; rectocele discovered.  Musculoskeletal: Positive for neck pain and neck stiffness.       Has  hx of scoliosis and lumbar DDD.  Skin: Negative.   Allergic/Immunologic: Negative.   Neurological: Negative.   Hematological: Negative.   Psychiatric/Behavioral: Negative.       Objective:   Physical Exam  Nursing note and vitals reviewed. Constitutional: She is oriented to person, place, and time. She appears well-developed and well-nourished. She does not appear ill. No distress.  BP noted to be low; usual BP range 100-120/ 60-80.  HENT:  Head: Normocephalic and atraumatic.  Right Ear: Hearing, tympanic membrane, external ear and ear canal normal.  Left Ear: Hearing, tympanic membrane, external ear and ear canal normal.  Nose: Nose normal. No mucosal edema, nasal deformity or septal deviation.  Mouth/Throat: Uvula is midline, oropharynx is clear and moist and mucous membranes are normal. No oral lesions. Normal dentition.  Eyes: Conjunctivae, EOM and lids are normal. Pupils are equal, round, and reactive to light. No scleral icterus.  Neck: Trachea normal, normal range of motion, full passive range of motion without pain and phonation normal. Neck supple. No JVD present. No spinous process tenderness and no muscular tenderness present. Carotid bruit is not present. No mass and no thyromegaly present.  Cardiovascular: Normal rate, regular rhythm, S1 normal, S2 normal, normal heart sounds and normal pulses.   No extrasystoles are present. PMI is not displaced.  Exam reveals no gallop and no friction rub.   No murmur heard. Pulmonary/Chest: Effort normal and breath sounds normal. No respiratory distress. She has no decreased breath sounds. She has no wheezes.  Abdominal: Soft. Normal appearance and normal aorta. She exhibits no distension, no pulsatile midline mass and no mass. Bowel sounds are decreased. There is no hepatosplenomegaly. There is no  tenderness. There is no guarding and no CVA tenderness.  Genitourinary:  Deferred.  Musculoskeletal:       Cervical back: Normal.       Thoracic back: Normal.       Lumbar back: Normal.  Remainder of exam unremarkable.  Lymphadenopathy:       Head (right side): No submental, no submandibular, no tonsillar, no preauricular, no posterior auricular and no occipital adenopathy present.       Head (left side): No submental, no submandibular, no tonsillar, no preauricular, no posterior auricular and no occipital adenopathy present.    She has no cervical adenopathy.    She has no axillary adenopathy.       Right: No inguinal and no supraclavicular adenopathy present.       Left: No inguinal and no supraclavicular adenopathy present.  Neurological: She is alert and oriented to person, place, and time. She has normal strength and normal reflexes. She displays no atrophy. No cranial nerve deficit or sensory deficit. She exhibits normal muscle tone. She displays a negative Romberg sign. Coordination and gait normal.  Get up and Go test: < 10 sec  Skin: Skin is warm, dry and intact. No ecchymosis, no lesion and no rash noted. She is not diaphoretic. No cyanosis or erythema. No pallor. Nails show no clubbing.  Psychiatric: She has a normal mood and affect. Her speech is normal and behavior is normal. Judgment and thought content normal. Cognition and memory are normal.   BP 100/66 mmHg  Pulse 66  Temp(Src) 97.6 F (36.4 C) (Oral)  Resp 16  Ht 5\' 6"  (1.676 m)  Wt 175 lb 3.2 oz (79.47 kg)  BMI 28.29 kg/m2  LMP 08/03/1998     Assessment & Plan:    1. Overweight (BMI 25.0-29.9)   2.  Encounter for Medicare annual wellness exam  - Flu Vaccine QUAD 36+ mos IM given today and prevnar given today.  All vaccines UTD To old to need once time hep c and hiv testing  3. Visit for screening mammogram  - Pt does not have any sig family h/o breast cancer and no personal risk factors so ok to do biennial  mammogram as recommended by USPSTF (who also recommend stopping at 63 but will likely continue further since pt has a longer life expentency due to excellent health.  4. Need for immunization against influenza   5. Hypercholesteremia - Currently taking only fish oil and maintaining healthy lifestyle.  6. Difficulty urinating   7. Screening for deficiency anemia     Orders Placed This Encounter  Procedures  . Flu Vaccine QUAD 36+ mos IM  . Pneumococcal conjugate vaccine 13-valent IM  . Lipid panel    Order Specific Question:  Has the patient fasted?    Answer:  Yes  . Comprehensive metabolic panel    Order Specific Question:  Has the patient fasted?    Answer:  Yes  . CBC  . POCT urinalysis dipstick    Meds ordered this encounter  Medications  . DISCONTD: diclofenac (VOLTAREN) 75 MG EC tablet    Sig: Take 1 tablet (75 mg total) by mouth 2 (two) times daily as needed for mild pain.    Dispense:  60 tablet    Refill:  5    [name], MD MPH  Results for orders placed or performed in visit on 05/09/15  Lipid panel  Result Value Ref Range   Cholesterol 249 (H) 125 - 200 mg/dL   Triglycerides 154 (H) <150 mg/dL   HDL 49 >=46 mg/dL   Total CHOL/HDL Ratio 5.1 (H) <=5.0 Ratio   VLDL 31 (H) <30 mg/dL   LDL Cholesterol 169 (H) <130 mg/dL  Comprehensive metabolic panel  Result Value Ref Range   Sodium 141 135 - 146 mmol/L   Potassium 4.6 3.5 - 5.3 mmol/L   Chloride 106 98 - 110 mmol/L   CO2 28 20 - 31 mmol/L   Glucose, Bld 81 65 - 99 mg/dL   BUN 19 7 - 25 mg/dL   Creat 0.78 0.60 - 0.93 mg/dL   Total Bilirubin 0.9 0.2 - 1.2 mg/dL   Alkaline Phosphatase 83 33 - 130 U/L   AST 19 10 - 35 U/L   ALT 18 6 - 29 U/L   Total Protein 6.9 6.1 - 8.1 g/dL   Albumin 4.2 3.6 - 5.1 g/dL   Calcium 9.4 8.6 - 10.4 mg/dL  CBC  Result Value Ref Range   WBC 5.8 4.0 - 10.5 K/uL   RBC 5.13 (H) 3.87 - 5.11 MIL/uL   Hemoglobin 14.9 12.0 - 15.0 g/dL   HCT 44.8 36.0 - 46.0 %   MCV 87.3 78.0 -  100.0 fL   MCH 29.0 26.0 - 34.0 pg   MCHC 33.3 30.0 - 36.0 g/dL   RDW 13.9 11.5 - 15.5 %   Platelets 263 150 - 400 K/uL   MPV 9.8 8.6 - 12.4 fL  POCT urinalysis dipstick  Result Value Ref Range   Color, UA yellow yellow   Clarity, UA cloudy (A) clear   Glucose, UA negative negative   Bilirubin, UA negative negative   Ketones, POC UA negative negative   Spec Grav, UA 1.025    Blood, UA negative negative   pH, UA 5.5    Protein  Ur, POC negative negative   Urobilinogen, UA 0.2    Nitrite, UA Negative Negative   Leukocytes, UA Negative Negative

## 2015-05-09 NOTE — Patient Instructions (Signed)

## 2015-05-10 ENCOUNTER — Encounter: Payer: Medicare Other | Admitting: Family Medicine

## 2015-05-15 ENCOUNTER — Encounter: Payer: Self-pay | Admitting: Family Medicine

## 2015-05-16 ENCOUNTER — Encounter: Payer: Medicare Other | Admitting: Family Medicine

## 2015-05-28 ENCOUNTER — Other Ambulatory Visit: Payer: Self-pay | Admitting: Family Medicine

## 2015-05-28 MED ORDER — DICLOFENAC SODIUM 75 MG PO TBEC: 75.0000 mg | DELAYED_RELEASE_TABLET | Freq: Two times a day (BID) | ORAL | Status: DC | PRN

## 2015-07-08 ENCOUNTER — Encounter: Payer: Self-pay | Admitting: Physician Assistant

## 2015-07-08 ENCOUNTER — Ambulatory Visit (INDEPENDENT_AMBULATORY_CARE_PROVIDER_SITE_OTHER): Payer: Medicare Other | Admitting: Physician Assistant

## 2015-07-08 VITALS — BP 113/67 | HR 66 | Temp 98.5°F | Resp 16 | Ht 65.0 in | Wt 176.0 lb

## 2015-07-08 DIAGNOSIS — R1011 Right upper quadrant pain: Secondary | ICD-10-CM | POA: Diagnosis not present

## 2015-07-08 DIAGNOSIS — K219 Gastro-esophageal reflux disease without esophagitis: Secondary | ICD-10-CM

## 2015-07-08 DIAGNOSIS — R1013 Epigastric pain: Secondary | ICD-10-CM

## 2015-07-08 DIAGNOSIS — N644 Mastodynia: Secondary | ICD-10-CM | POA: Diagnosis not present

## 2015-07-08 LAB — COMPLETE METABOLIC PANEL WITH GFR
ALBUMIN: 4.4 g/dL (ref 3.6–5.1)
ALK PHOS: 83 U/L (ref 33–130)
ALT: 18 U/L (ref 6–29)
AST: 16 U/L (ref 10–35)
BILIRUBIN TOTAL: 0.4 mg/dL (ref 0.2–1.2)
BUN: 16 mg/dL (ref 7–25)
CALCIUM: 9.2 mg/dL (ref 8.6–10.4)
CO2: 27 mmol/L (ref 20–31)
Chloride: 102 mmol/L (ref 98–110)
Creat: 0.86 mg/dL (ref 0.60–0.93)
GFR, EST AFRICAN AMERICAN: 78 mL/min (ref >=60)
GFR, EST NON AFRICAN AMERICAN: 67 mL/min (ref >=60)
Glucose, Bld: 86 mg/dL (ref 65–99)
Potassium: 4.6 mmol/L (ref 3.5–5.3)
SODIUM: 139 mmol/L (ref 135–146)
TOTAL PROTEIN: 6.9 g/dL (ref 6.1–8.1)

## 2015-07-08 LAB — CBC
HEMATOCRIT: 45.9 % (ref 36.0–46.0)
HEMOGLOBIN: 15 g/dL (ref 12.0–15.0)
MCH: 29.2 pg (ref 26.0–34.0)
MCHC: 32.7 g/dL (ref 30.0–36.0)
MCV: 89.3 fL (ref 78.0–100.0)
MPV: 10 fL (ref 8.6–12.4)
Platelets: 299 10*3/uL (ref 150–400)
RBC: 5.14 MIL/uL — AB (ref 3.87–5.11)
RDW: 13.2 % (ref 11.5–15.5)
WBC: 7.6 10*3/uL (ref 4.0–10.5)

## 2015-07-08 LAB — LIPASE: LIPASE: 53 U/L (ref 7–60)

## 2015-07-08 MED ORDER — RANITIDINE HCL 150 MG PO TABS: 150.0000 mg | ORAL_TABLET | Freq: Two times a day (BID) | ORAL | Status: DC

## 2015-07-08 MED ORDER — OMEPRAZOLE 20 MG PO CPDR: 20.0000 mg | DELAYED_RELEASE_CAPSULE | Freq: Every day | ORAL | Status: DC

## 2015-07-08 NOTE — Progress Notes (Signed)
[facility] [address], [place] [address] [phone]  Date:  07/08/2015   Name:  Cynthia Castro   DOB:  1940/10/05   MRN:  [id number]  PCP:  [name], MD    History of Present Illness:  Cynthia Castro is a 74 y.o. female patient who presents to [facility] for cc of abdominal pain, and lower right sided chest pain.   She complains of epigastric pain that is really sharp.  Occurs intermittently, lasting for 3 days, and then resolving.  She can also feel associated pressure across her abdomen.  Worse pain occurred 3 days ago, which prompted her visit.  She has no pain today.  She states it pain is prompted and exacerbated when she is eating tomato sauce, wine, chocolate, and coffee.  She keeps a food diary.  Also occurs when she overeats, and then sits.  Standing helps to relieve the pain.  There is no associated nausea, blood in stool, melena, or sour taste.  No obvious heartburn.  No associated diaphoresis, palpitatons, sob, or dizziness.  Recently taking diclofenac more daily though this was rare, and before the start of these symptoms. Hydrates for minimal relief.   Also complains of separate breast pain at right breast.  Feels very deep upon touching it.  No change in skin or breast.  No fever.  No redness.  No hx of breast cancer.  No menstrual breast tenderness apparent through her menstrual cycle.  Post menopause.  Last breast exam 1.3 years ago, which was normal.   No unintentional weight loss.  Patient Active Problem List   Diagnosis Date Noted  . DDD (degenerative disc disease), lumbar 11/24/2013  . Difficulty urinating 05/09/2013  . Rectocele 12/22/2012  . Diverticulosis of sigmoid colon 08/24/2012  . Seborrheic keratosis 06/27/2012  . Hypercholesteremia 04/21/2012  . Scoliosis of lumbar spine 04/21/2012  . Overweight (BMI 25.0-29.9) 04/21/2012    Past Medical History  Diagnosis Date  . Arthritis   . Hyperlipidemia   . DDD (degenerative disc  disease), cervical   . History of kidney stones 05/2013    Past Surgical History  Procedure Laterality Date  . Cholecystectomy  2005  . Cesarean section  1984  . Cystectomy  2003  . Uterine cyst removal  2005  . Hysteroscopic resection      hysteroscopic resection of polyp  . Lithotripsy  04/2013    --Dr. Janice Norrie    Social History  Substance Use Topics  . Smoking status: Never Smoker   . Smokeless tobacco: Never Used  . Alcohol Use: 0.6 oz/week    1 Glasses of wine per week     Comment: WINE OR BEER - 1 OR 2 A WEEK    Family History  Problem Relation Age of Onset  . Diabetes Mother   . Heart disease Mother     CHF  . Vision loss Mother   . Cancer Father     TESTICULAR  . Stroke Father 18  . Hyperlipidemia Father   . Hyperlipidemia Maternal Grandmother   . Heart disease Maternal Grandfather   . Diabetes Maternal Grandfather   . Anxiety disorder Brother   . Seizures Brother   . Cancer Paternal Grandmother   . Cancer Paternal Grandfather     No Known Allergies  Medication list has been reviewed and updated.  Current Outpatient Prescriptions on File Prior to Visit  Medication Sig Dispense Refill  . diclofenac (VOLTAREN) 75 MG EC tablet Take 1  tablet (75 mg total) by mouth 2 (two) times daily as needed for moderate pain. 60 tablet 5  . fish oil-omega-3 fatty acids 1000 MG capsule Take 2 g by mouth daily.    . IBUPROFEN PO Take by mouth as needed.    Marland Kitchen MELATONIN PO Take by mouth as needed.    . TURMERIC PO Take by mouth. CAPSULE    . Multiple Vitamins-Minerals (MULTIVITAMIN PO) Take by mouth daily.     No current facility-administered medications on file prior to visit.    ROS ROS otherwise unremarkable unless listed above.   Physical Examination: BP 113/67 mmHg  Pulse 66  Temp(Src) 98.5 F (36.9 C) (Oral)  Resp 16  Ht 5\' 5"  (1.651 m)  Wt 176 lb (79.833 kg)  BMI 29.29 kg/m2  SpO2 96%  LMP 08/03/1998 Ideal Body Weight: Weight in (lb) to have BMI = 25:  149.9  Physical Exam  Constitutional: She is oriented to person, place, and time. She appears well-developed and well-nourished. No distress.  HENT:  Head: Normocephalic and atraumatic.  Right Ear: External ear normal.  Left Ear: External ear normal.  Eyes: Conjunctivae and EOM are normal. Pupils are equal, round, and reactive to light.  Cardiovascular: Normal rate.   Pulmonary/Chest: Effort normal. No respiratory distress. She has no decreased breath sounds. She has no wheezes. She has no rhonchi. She exhibits no bony tenderness, no edema and no swelling. Right breast exhibits no inverted nipple.  Adjacent to distal right sternal border, tenderness along the breast, with fibrotic tissue palpated.  No immobile firm nodules present.    Abdominal: Soft. Normal appearance and bowel sounds are normal. There is no hepatosplenomegaly. There is no tenderness.  Neurological: She is alert and oriented to person, place, and time.  Skin: She is not diaphoretic.  Psychiatric: She has a normal mood and affect. Her behavior is normal.     Assessment and Plan: Cynthia Castro is a 74 y.o. female who is here today for abdominal pain, and unassociated breast pain. Diff: gerd, pancreatitis, pud, gallbladder.  This is very likely gerd through history.  Starting her on PPI and H2 blocker for the next 2 weeks.  Given lifestyle modifications, which appear to be major catalyst, and could otherwise be well controlled.  She will let me know if this does not improve after 4 weeks.  Will send to gastro for consult, if no improvement. [place] with diagnostic mammogram for evaluation.      Gastroesophageal reflux disease, esophagitis presence not specified - Plan: ranitidine (ZANTAC) 150 MG tablet, omeprazole (PRILOSEC) 20 MG capsule  Epigastric pain - Plan: CBC, COMPLETE METABOLIC PANEL WITH GFR, Lipase, ranitidine (ZANTAC) 150 MG tablet  RUQ pain - Plan: CBC, COMPLETE METABOLIC PANEL WITH GFR, Lipase, ranitidine  (ZANTAC) 150 MG tablet  Pain of right breast - Plan: MM Digital Diagnostic Unilat R, US BREAST COMPLETE UNI RIGHT INC AXILLA   Ivar Drape, PA-C [facility] 07/08/2015 3:09 PM

## 2015-07-08 NOTE — Patient Instructions (Signed)
Please take this medication for the next 2 weeks, then I would like you to take it symptomatically as treatment.  If it is not helping, I would like you to let me know.   In the meantime let's follow these lifestyle modifications along with the right food choices.   I will be in contact with you about the results of your labs.  Gastroesophageal Reflux Disease, Adult Normally, food travels down the esophagus and stays in the stomach to be digested. However, when a person has gastroesophageal reflux disease (GERD), food and stomach acid move back up into the esophagus. When this happens, the esophagus becomes sore and inflamed. Over time, GERD can create small holes (ulcers) in the lining of the esophagus.  CAUSES This condition is caused by a problem with the muscle between the esophagus and the stomach (lower esophageal sphincter, or LES). Normally, the LES muscle closes after food passes through the esophagus to the stomach. When the LES is weakened or abnormal, it does not close properly, and that allows food and stomach acid to go back up into the esophagus. The LES can be weakened by certain dietary substances, medicines, and medical conditions, including:  Tobacco use.  Pregnancy.  Having a hiatal hernia.  Heavy alcohol use.  Certain foods and beverages, such as coffee, chocolate, onions, and peppermint. RISK FACTORS This condition is more likely to develop in:  People who have an increased body weight.  People who have connective tissue disorders.  People who use NSAID medicines. SYMPTOMS Symptoms of this condition include:  Heartburn.  Difficult or painful swallowing.  The feeling of having a lump in the throat.  Abitter taste in the mouth.  Bad breath.  Having a large amount of saliva.  Having an upset or bloated stomach.  Belching.  Chest pain.  Shortness of breath or wheezing.  Ongoing (chronic) cough or a night-time cough.  Wearing away of tooth  enamel.  Weight loss. Different conditions can cause chest pain. Make sure to see your health care provider if you experience chest pain. DIAGNOSIS Your health care provider will take a medical history and perform a physical exam. To determine if you have mild or severe GERD, your health care provider may also monitor how you respond to treatment. You may also have other tests, including:  An endoscopy toexamine your stomach and esophagus with a small camera.  A test thatmeasures the acidity level in your esophagus.  A test thatmeasures how much pressure is on your esophagus.  A barium swallow or modified barium swallow to show the shape, size, and functioning of your esophagus. TREATMENT The goal of treatment is to help relieve your symptoms and to prevent complications. Treatment for this condition may vary depending on how severe your symptoms are. Your health care provider may recommend:  Changes to your diet.  Medicine.  Surgery. HOME CARE INSTRUCTIONS Diet  Follow a diet as recommended by your health care provider. This may involve avoiding foods and drinks such as:  Coffee and tea (with or without caffeine).  Drinks that containalcohol.  Energy drinks and sports drinks.  Carbonated drinks or sodas.  Chocolate and cocoa.  Peppermint and mint flavorings.  Garlic and onions.  Horseradish.  Spicy and acidic foods, including peppers, chili powder, curry powder, vinegar, hot sauces, and barbecue sauce.  Citrus fruit juices and citrus fruits, such as oranges, lemons, and limes.  Tomato-based foods, such as red sauce, chili, salsa, and pizza with red sauce.  [place] and  fatty foods, such as donuts, french fries, potato chips, and high-fat dressings.  High-fat meats, such as hot dogs and fatty cuts of red and white meats, such as rib eye steak, sausage, ham, and bacon.  High-fat dairy items, such as whole milk, butter, and cream cheese.  Eat small, frequent  meals instead of large meals.  Avoid drinking large amounts of liquid with your meals.  Avoid eating meals during the 2-3 hours before bedtime.  Avoid lying down right after you eat.  Do not exercise right after you eat. General Instructions  Pay attention to any changes in your symptoms.  Take over-the-counter and prescription medicines only as told by your health care provider. Do not take aspirin, ibuprofen, or other NSAIDs unless your health care provider told you to do so.  Do not use any tobacco products, including cigarettes, chewing tobacco, and e-cigarettes. If you need help quitting, ask your health care provider.  Wear loose-fitting clothing. Do not wear anything tight around your waist that causes pressure on your abdomen.  Raise (elevate) the head of your bed 6 inches (15cm).  Try to reduce your stress, such as with yoga or meditation. If you need help reducing stress, ask your health care provider.  If you are overweight, reduce your weight to an amount that is healthy for you. Ask your health care provider for guidance about a safe weight loss goal.  Keep all follow-up visits as told by your health care provider. This is important. SEEK MEDICAL CARE IF:  You have new symptoms.  You have unexplained weight loss.  You have difficulty swallowing, or it hurts to swallow.  You have wheezing or a persistent cough.  Your symptoms do not improve with treatment.  You have a hoarse voice. SEEK IMMEDIATE MEDICAL CARE IF:  You have pain in your arms, neck, jaw, teeth, or back.  You feel sweaty, dizzy, or light-headed.  You have chest pain or shortness of breath.  You vomit and your vomit looks like blood or coffee grounds.  You faint.  Your stool is bloody or black.  You cannot swallow, drink, or eat.   This information is not intended to replace advice given to you by your health care provider. Make sure you discuss any questions you have with your health  care provider.   Document Released: 04/29/2005 Document Revised: 04/10/2015 Document Reviewed: 11/14/2014 Elsevier Interactive Patient Education 2016 [place] for Gastroesophageal Reflux Disease, Adult When you have gastroesophageal reflux disease (GERD), the foods you eat and your eating habits are very important. Choosing the right foods can help ease the discomfort of GERD. WHAT GENERAL GUIDELINES DO I NEED TO FOLLOW?  Choose fruits, vegetables, whole grains, low-fat dairy products, and low-fat meat, fish, and poultry.  Limit fats such as oils, salad dressings, butter, nuts, and avocado.  Keep a food diary to identify foods that cause symptoms.  Avoid foods that cause reflux. These may be different for different people.  Eat frequent small meals instead of three large meals each day.  Eat your meals slowly, in a relaxed setting.  Limit fried foods.  Cook foods using methods other than frying.  Avoid drinking alcohol.  Avoid drinking large amounts of liquids with your meals.  Avoid bending over or lying down until 2-3 hours after eating. WHAT FOODS ARE NOT RECOMMENDED? The following are some foods and drinks that may worsen your symptoms: Vegetables Tomatoes. Tomato juice. Tomato and spaghetti sauce. Chili peppers. Onion and garlic. Horseradish.  Fruits Oranges, grapefruit, and lemon (fruit and juice). Meats High-fat meats, fish, and poultry. This includes hot dogs, ribs, ham, sausage, salami, and bacon. Dairy Whole milk and chocolate milk. Sour cream. Cream. Butter. Ice cream. Cream cheese.  Beverages Coffee and tea, with or without caffeine. Carbonated beverages or energy drinks. Condiments Hot sauce. Barbecue sauce.  Sweets/Desserts Chocolate and cocoa. Donuts. Peppermint and spearmint. Fats and Oils High-fat foods, including [place] fries and potato chips. Other Vinegar. Strong spices, such as black pepper, white pepper, red pepper, cayenne,  curry powder, cloves, ginger, and chili powder. The items listed above may not be a complete list of foods and beverages to avoid. Contact your dietitian for more information.   This information is not intended to replace advice given to you by your health care provider. Make sure you discuss any questions you have with your health care provider.   Document Released: 07/20/2005 Document Revised: 08/10/2014 Document Reviewed: 05/24/2013 Elsevier Interactive Patient Education [employer].

## 2015-07-11 DIAGNOSIS — K219 Gastro-esophageal reflux disease without esophagitis: Secondary | ICD-10-CM | POA: Insufficient documentation

## 2015-07-22 ENCOUNTER — Encounter: Payer: Self-pay | Admitting: Physician Assistant

## 2015-07-24 ENCOUNTER — Other Ambulatory Visit: Payer: Self-pay

## 2015-07-24 DIAGNOSIS — N644 Mastodynia: Secondary | ICD-10-CM

## 2015-07-31 ENCOUNTER — Ambulatory Visit
Admission: RE | Admit: 2015-07-31 | Discharge: 2015-07-31 | Disposition: A | Discharge status: Home / Self Care | Payer: Medicare Other | Source: Ambulatory Visit | Attending: Physician Assistant | Admitting: Physician Assistant

## 2015-07-31 DIAGNOSIS — N644 Mastodynia: Secondary | ICD-10-CM

## 2015-07-31 DIAGNOSIS — N6489 Other specified disorders of breast: Secondary | ICD-10-CM | POA: Diagnosis not present

## 2015-07-31 DIAGNOSIS — R928 Other abnormal and inconclusive findings on diagnostic imaging of breast: Secondary | ICD-10-CM | POA: Diagnosis not present

## 2016-01-08 ENCOUNTER — Ambulatory Visit (INDEPENDENT_AMBULATORY_CARE_PROVIDER_SITE_OTHER): Payer: Medicare Other | Admitting: Family Medicine

## 2016-01-08 VITALS — BP 118/70 | HR 89 | Temp 98.3°F | Resp 16 | Ht 65.0 in | Wt 178.0 lb

## 2016-01-08 DIAGNOSIS — R002 Palpitations: Secondary | ICD-10-CM

## 2016-01-08 LAB — COMPREHENSIVE METABOLIC PANEL
ALBUMIN: 4.3 g/dL (ref 3.6–5.1)
ALT: 16 U/L (ref 6–29)
AST: 17 U/L (ref 10–35)
Alkaline Phosphatase: 83 U/L (ref 33–130)
BILIRUBIN TOTAL: 0.5 mg/dL (ref 0.2–1.2)
BUN: 15 mg/dL (ref 7–25)
CALCIUM: 8.7 mg/dL (ref 8.6–10.4)
CHLORIDE: 105 mmol/L (ref 98–110)
CO2: 22 mmol/L (ref 20–31)
CREATININE: 0.71 mg/dL (ref 0.60–0.93)
GLUCOSE: 82 mg/dL (ref 65–99)
Potassium: 4.1 mmol/L (ref 3.5–5.3)
SODIUM: 140 mmol/L (ref 135–146)
Total Protein: 6.7 g/dL (ref 6.1–8.1)

## 2016-01-08 LAB — POCT URINALYSIS DIP (MANUAL ENTRY)
Bilirubin, UA: NEGATIVE
Blood, UA: NEGATIVE
Glucose, UA: NEGATIVE
Ketones, POC UA: NEGATIVE
Nitrite, UA: NEGATIVE
Protein Ur, POC: NEGATIVE
Spec Grav, UA: 1.015
Urobilinogen, UA: 0.2
pH, UA: 6

## 2016-01-08 LAB — POCT CBC
GRANULOCYTE PERCENT: 62.1 % (ref 37–80)
HEMATOCRIT: 43.4 % (ref 37.7–47.9)
HEMOGLOBIN: 15.2 g/dL (ref 12.2–16.2)
Lymph, poc: 2.9 (ref 0.6–3.4)
MCH, POC: 29.6 pg (ref 27–31.2)
MCHC: 35 g/dL (ref 31.8–35.4)
MCV: 84.6 fL (ref 80–97)
MID (cbc): 0.7 (ref 0–0.9)
MPV: 7.8 fL (ref 0–99.8)
POC GRANULOCYTE: 6 (ref 2–6.9)
POC LYMPH PERCENT: 30.4 %L (ref 10–50)
POC MID %: 7.5 % (ref 0–12)
Platelet Count, POC: 231 10*3/uL (ref 142–424)
RBC: 5.12 M/uL (ref 4.04–5.48)
RDW, POC: 13.8 %
WBC: 9.7 10*3/uL (ref 4.6–10.2)

## 2016-01-08 LAB — POC MICROSCOPIC URINALYSIS (UMFC): Mucus: ABSENT

## 2016-01-08 LAB — TSH: TSH: 2.79 m[IU]/L

## 2016-01-08 NOTE — Patient Instructions (Addendum)
IF you received an x-ray today, you will receive an invoice from [facility]. Please contact [facility] at [phone] with questions or concerns regarding your invoice.   IF you received labwork today, you will receive an invoice from [employer]. Please contact [facility] at [phone] with questions or concerns regarding your invoice.   Our billing staff will not be able to assist you with questions regarding bills from these companies.  You will be contacted with the lab results as soon as they are available. The fastest way to get your results is to activate your My Chart account. Instructions are located on the last page of this paperwork. If you have not heard from [place] regarding the results in 2 weeks, please contact this office.     Palpitations A palpitation is the feeling that your heartbeat is irregular or is faster than normal. It may feel like your heart is fluttering or skipping a beat. Palpitations are usually not a serious problem. However, in some cases, you may need further medical evaluation. CAUSES  Palpitations can be caused by:  Smoking.  Caffeine or other stimulants, such as diet pills or energy drinks.  Alcohol.  Stress and anxiety.  Strenuous physical activity.  Fatigue.  Certain medicines.  Heart disease, especially if you have a history of irregular heart rhythms (arrhythmias), such as atrial fibrillation, atrial flutter, or supraventricular tachycardia.  An improperly working pacemaker or defibrillator. DIAGNOSIS  To find the cause of your palpitations, your health care provider will take your medical history and perform a physical exam. Your health care provider may also have you take a test called an ambulatory electrocardiogram (ECG). An ECG records your heartbeat patterns over a 24-hour period. You may also have other tests, such as:  Transthoracic echocardiogram (TTE). During echocardiography,  sound waves are used to evaluate how blood flows through your heart.  Transesophageal echocardiogram (TEE).  Cardiac monitoring. This allows your health care provider to monitor your heart rate and rhythm in real time.  Holter monitor. This is a portable device that records your heartbeat and can help diagnose heart arrhythmias. It allows your health care provider to track your heart activity for several days, if needed.  Stress tests by exercise or by giving medicine that makes the heart beat faster. TREATMENT  Treatment of palpitations depends on the cause of your symptoms and can vary greatly. Most cases of palpitations do not require any treatment other than time, relaxation, and monitoring your symptoms. Other causes, such as atrial fibrillation, atrial flutter, or supraventricular tachycardia, usually require further treatment. HOME CARE INSTRUCTIONS   Avoid:  Caffeinated coffee, tea, soft drinks, diet pills, and energy drinks.  Chocolate.  Alcohol.  Stop smoking if you smoke.  Reduce your stress and anxiety. Things that can help you relax include:  A method of controlling things in your body, such as your heartbeats, with your mind (biofeedback).  Yoga.  Meditation.  Physical activity such as swimming, jogging, or walking.  Get plenty of rest and sleep. SEEK MEDICAL CARE IF:   You continue to have a fast or irregular heartbeat beyond 24 hours.  Your palpitations occur more often. SEEK IMMEDIATE MEDICAL CARE IF:  You have chest pain or shortness of breath.  You have a severe headache.  You feel dizzy or you faint. MAKE SURE YOU:  Understand these instructions.  Will watch your condition.  Will get help right away if you are not doing well or get worse.  This information is not intended to replace advice given to you by your health care provider. Make sure you discuss any questions you have with your health care provider.   Document Released: 07/17/2000  Document Revised: 07/25/2013 Document Reviewed: 09/18/2011 Elsevier Interactive Patient Education 2016 Elsevier Inc[employer]  Premature Ventricular Contraction A premature ventricular contraction is an irregularity in the normal heart rhythm. These contractions are extra heartbeats that occur too early in the normal sequence. In most cases, these contractions are harmless and do not require treatment. CAUSES Premature ventricular contractions may occur without a known cause. In healthy people, the extra contractions may be caused by:  Smoking.  Drinking alcohol.  Caffeine.  Certain medicines.  Some illegal drugs.  Stress. Sometimes, changes in chemicals in the blood (electrolytes) can also cause premature ventricular contractions. They can also occur in people with heart diseases that cause a decrease in blood flow to the heart. SIGNS AND SYMPTOMS Premature ventricular contractions often do not cause any symptoms. In some cases, you may have a feeling of your heart beating fast or skipping a beat (palpitations). DIAGNOSIS Your health care provider will take your medical history and do a physical exam. During the exam, the health care provider will check for irregular heartbeats. Various tests may be done to help diagnose premature ventricular contractions. These tests may include:  An ECG (electrocardiogram) to monitor the electrical activity of your heart.  Holter monitor testing. A Holter monitor is a portable device that can monitor the electrical activity of your heart over longer periods of time.  Stress tests to see how exercise affects your heart rhythm.  Echocardiogram. This test uses sound waves (ultrasound) to produce an image of your heart.  Electrophysiology study. This is used to evaluate the electrical conduction system of your heart. TREATMENT Usually, no treatment is needed. You may be advised to avoid things that can trigger the premature contractions, such as caffeine or  alcohol. Medicines are sometimes given if symptoms are severe or if the extra heartbeats are very frequent. Treatment may also be needed for an underlying cause of the contractions if one is found. HOME CARE INSTRUCTIONS  Take medicines only as directed by your health care provider.  Make any lifestyle changes recommended by your health care provider. These may include:  Quitting smoking.  Avoiding or limiting caffeine or alcohol.  Exercising. Talk to your health care provider about what type of exercise is safe for you.  Trying to reduce stress.  Keep all follow-up visits with your health care provider. This is important. SEEK IMMEDIATE MEDICAL CARE IF:  You feel palpitations that are frequent or continual.  You have chest pain.  You have shortness of breath.  You have sweating for no reason.  You have nausea and vomiting.  You become light-headed or faint.   This information is not intended to replace advice given to you by your health care provider. Make sure you discuss any questions you have with your health care provider.   Document Released: 03/06/2004 Document Revised: 08/10/2014 Document Reviewed: 12/21/2013 Elsevier Interactive Patient Education [employer].

## 2016-01-08 NOTE — Progress Notes (Signed)
By signing my name below, I, Mesha Guinyard, attest that this documentation has been prepared under the direction and in the presence of [name], MD.  Electronically Signed: Verlee Monte, Medical Scribe. 01/08/2016. 2:51 PM.  Subjective:    Patient ID: Cynthia Castro, female    DOB: Jan 11, 1941, 75 y.o.   MRN: [id number]  Palpitations  Associated symptoms include chest pain (occasional heart burn). Pertinent negatives include no coughing or shortness of breath.   Chief Complaint  Patient presents with  . Palpitations    started today    HPI Comments: Cynthia Castro is a 75 y.o. female who presents to the [facility] complaining of palpitations onset an hour after walking this morning. Pt was laying down 30 mins after eating a salad and drinking water for lunch when she noticed her palpitations. Pt noticed her heart beating, and her heart skipped every 10th beat. Pt has never noticed palpitations before. Pt reports fatigue, and tremors as if she hasn't eaten. Pt has been feeling more tired after her walks- unsure when it started. Pt had a bad HA and stopped walking for about 4 weeks. Pt has occasional heart burn and takes zantac to relieve her of her symptoms.  Pt walks for an hour an a half once a week. Pt denies sleep disturbance, abdominal pain, breathing troubles, coughing, or appetite change.  Patient Active Problem List   Diagnosis Date Noted  . GERD (gastroesophageal reflux disease) 07/11/2015  . DDD (degenerative disc disease), lumbar 11/24/2013  . Difficulty urinating 05/09/2013  . Rectocele 12/22/2012  . Diverticulosis of sigmoid colon 08/24/2012  . Seborrheic keratosis 06/27/2012  . Hypercholesteremia 04/21/2012  . Scoliosis of lumbar spine 04/21/2012  . Overweight (BMI 25.0-29.9) 04/21/2012   Past Medical History  Diagnosis Date  . Arthritis   . Hyperlipidemia   . DDD (degenerative disc disease), cervical   . History of kidney stones 05/2013    Past Surgical History  Procedure Laterality Date  . Cholecystectomy  2005  . Cesarean section  1984  . Cystectomy  2003  . Uterine cyst removal  2005  . Hysteroscopic resection      hysteroscopic resection of polyp  . Lithotripsy  04/2013    --Dr. Janice Norrie   No Known Allergies Prior to Admission medications   Medication Sig Start Date End Date Taking? Authorizing Provider  diclofenac (VOLTAREN) 75 MG EC tablet Take 1 tablet (75 mg total) by mouth 2 (two) times daily as needed for moderate pain. 05/28/15  Yes Shawnee Knapp, MD  fish oil-omega-3 fatty acids 1000 MG capsule Take 2 g by mouth daily.   Yes Historical Provider, MD  IBUPROFEN PO Take by mouth as needed.   Yes Historical Provider, MD  MELATONIN PO Take by mouth as needed.   Yes Historical Provider, MD  Multiple Vitamins-Minerals (MULTIVITAMIN PO) Take by mouth daily.   Yes Historical Provider, MD  omeprazole (PRILOSEC) 20 MG capsule Take 1 capsule (20 mg total) by mouth daily. 07/08/15  Yes Dorian Heckle English, PA  ranitidine (ZANTAC) 150 MG tablet Take 1 tablet (150 mg total) by mouth 2 (two) times daily. 07/08/15  Yes Stephanie D English, PA  TURMERIC PO Take by mouth. CAPSULE   Yes Historical Provider, MD   Social History   Social History  . Marital Status: Divorced    Spouse Name: N/A  . Number of Children: N/A  . Years of Education: N/A   Occupational History  . Self Employed  Editor    Social History Main Topics  . Smoking status: Never Smoker   . Smokeless tobacco: Never Used  . Alcohol Use: 0.6 oz/week    1 Glasses of wine per week     Comment: WINE OR BEER - 1 OR 2 A WEEK  . Drug Use: No  . Sexual Activity: No   Other Topics Concern  . Not on file   Social History Narrative   Divorced. Education: [employer]. Exercise: Walk/Yoga 3 times a week for 30 minutes.    Review of Systems  Constitutional: Negative for appetite change and unexpected weight change.  HENT: Negative for rhinorrhea and sneezing.   Eyes:  Positive for itching.  Respiratory: Negative for cough, shortness of breath and wheezing.   Cardiovascular: Positive for chest pain (occasional heart burn) and palpitations.  Gastrointestinal: Negative for abdominal pain, diarrhea and constipation.  Neurological: Positive for tremors.  Psychiatric/Behavioral: Negative for sleep disturbance.     Objective:  BP 118/70 mmHg  Pulse 89  Temp(Src) 98.3 F (36.8 C) (Oral)  Resp 16  Ht 5\' 5"  (1.651 m)  Wt 178 lb (80.74 kg)  BMI 29.62 kg/m2  SpO2 96%  LMP 08/03/1998  Physical Exam  Constitutional: She appears well-developed and well-nourished. No distress.  HENT:  Head: Normocephalic and atraumatic.  Eyes: Conjunctivae are normal.  Neck: Neck supple. No thyromegaly present.  Cardiovascular: Normal rate, regular rhythm and normal heart sounds.  Exam reveals no friction rub.   No murmur heard. Pulses:      Carotid pulses are 2+ on the right side, and 2+ on the left side.      Radial pulses are 2+ on the right side, and 2+ on the left side.  Occasional PVC No carotid bruits  Pulmonary/Chest: Breath sounds normal. No respiratory distress. She has no wheezes. She has no rales.  Musculoskeletal:  Radial pulses  Lymphadenopathy:    She has no cervical adenopathy.  Neurological: She is alert.  Skin: Skin is warm and dry.  Psychiatric: She has a normal mood and affect. Her behavior is normal.  Nursing note and vitals reviewed.  EKG Reading: No significant change from 08/24/12. Her rhythm strip showed nl sinus rhythm  Results for orders placed or performed in visit on 01/08/16  POCT CBC  Result Value Ref Range   WBC 9.7 4.6 - 10.2 K/uL   Lymph, poc 2.9 0.6 - 3.4   POC LYMPH PERCENT 30.4 10 - 50 %L   MID (cbc) 0.7 0 - 0.9   POC MID % 7.5 0 - 12 %M   POC Granulocyte 6.0 2 - 6.9   Granulocyte percent 62.1 37 - 80 %G   RBC 5.12 4.04 - 5.48 M/uL   Hemoglobin 15.2 12.2 - 16.2 g/dL   HCT, POC 43.4 37.7 - 47.9 %   MCV 84.6 80 - 97 fL    MCH, POC 29.6 27 - 31.2 pg   MCHC 35.0 31.8 - 35.4 g/dL   RDW, POC 13.8 %   Platelet Count, POC 231 142 - 424 K/uL   MPV 7.8 0 - 99.8 fL  POCT urinalysis dipstick  Result Value Ref Range   Color, UA yellow yellow   Clarity, UA clear clear   Glucose, UA negative negative   Bilirubin, UA negative negative   Ketones, POC UA negative negative   Spec Grav, UA 1.015    Blood, UA negative negative   pH, UA 6.0    Protein Ur, POC negative negative  Urobilinogen, UA 0.2    Nitrite, UA Negative Negative   Leukocytes, UA Trace (A) Negative  POCT Microscopic Urinalysis ([facility])  Result Value Ref Range   WBC,UR,HPF,POC Few (A) None WBC/hpf   RBC,UR,HPF,POC None None RBC/hpf   Bacteria None None, Too numerous to count   Mucus Absent Absent   Epithelial Cells, UR Per Microscopy Few (A) None, Too numerous to count cells/hpf   Assessment & Plan:   1. Palpitations     Orders Placed This Encounter  Procedures  . Comprehensive metabolic panel  . TSH  . POCT CBC  . POCT urinalysis dipstick  . POCT Microscopic Urinalysis ([facility])  . EKG 12-Lead    I personally performed the services described in this documentation, which was scribed in my presence. The recorded information has been reviewed and considered, and addended by me as needed.   [name], M.D.  [facility] [address] [place], [place] [address] [phone] phone [phone] fax  01/17/2016 11:32 PM

## 2016-01-18 ENCOUNTER — Other Ambulatory Visit: Payer: Self-pay | Admitting: Physician Assistant

## 2016-01-20 NOTE — Telephone Encounter (Signed)
Cynthia Castro, did you want pt to stay on this and give RFs?

## 2016-01-24 ENCOUNTER — Ambulatory Visit (INDEPENDENT_AMBULATORY_CARE_PROVIDER_SITE_OTHER): Payer: Medicare Other | Admitting: Obstetrics and Gynecology

## 2016-01-24 ENCOUNTER — Encounter: Payer: Self-pay | Admitting: Obstetrics and Gynecology

## 2016-01-24 VITALS — BP 110/58 | HR 66 | Resp 24 | Ht 65.0 in | Wt 180.4 lb

## 2016-01-24 DIAGNOSIS — Z01419 Encounter for gynecological examination (general) (routine) without abnormal findings: Secondary | ICD-10-CM | POA: Diagnosis not present

## 2016-01-24 DIAGNOSIS — N816 Rectocele: Secondary | ICD-10-CM | POA: Diagnosis not present

## 2016-01-24 NOTE — Progress Notes (Signed)
Patient ID: Cynthia Castro, female   DOB: 1940-11-17, 75 y.o.   MRN: [id number] 74 y.o. G4P3 Divorced Caucasian female here for annual exam.    Still using stool softeners, and this helps.  Managing with rectocele OK.   Denies any bleeding or spotting.   Fatigue comes and goes.  Struggling with weight.   Wants to loose 10 pounds. Weight watchers did not work.   Just saw Dr. Brigitte Pulse PCP for arrhythmia.  EKG OK.   States a change in the skin on her chest.    PCP:  [name], MD    Patient's last menstrual period was 08/03/1998.           Sexually active: No.  The current method of family planning is post menopausal status.    Exercising: Yes.    water aerobics and walking. Smoker:  no  Health Maintenance: Pap:  10-31-09 Neg History of abnormal Pap:  no MMG:  08-01-15 Diag.Bil/3D/Density B/Neg/BiRads1:[facility] Colonoscopy:  01/2011 normal with Dr. Michail Sermon BMD:   2009  Result  normal TDaP:  Up to date with PCP Gardasil:   N/A   Screening Labs:  Hb today: PCP, Urine today: PCP   reports that she has never smoked. She has never used smokeless tobacco. She reports that she drinks alcohol. She reports that she does not use illicit drugs.  Past Medical History  Diagnosis Date  . Arthritis   . Hyperlipidemia   . DDD (degenerative disc disease), cervical   . History of kidney stones 05/2013  . Reflux     Past Surgical History  Procedure Laterality Date  . Cholecystectomy  2005  . Cesarean section  1984  . Cystectomy  2003  . Uterine cyst removal  2005  . Hysteroscopic resection      hysteroscopic resection of polyp  . Lithotripsy  04/2013    --Dr. Janice Norrie    Current Outpatient Prescriptions  Medication Sig Dispense Refill  . diclofenac (VOLTAREN) 75 MG EC tablet Take 1 tablet (75 mg total) by mouth 2 (two) times daily as needed for moderate pain. 60 tablet 5  . IBUPROFEN PO Take by mouth as needed.    Marland Kitchen MELATONIN PO Take by mouth as needed.    . ranitidine  (ZANTAC) 150 MG tablet TAKE 1 TABLET(150 MG) BY MOUTH TWICE DAILY (Patient taking differently: 1 tablet prn) 180 tablet 0  . TURMERIC PO Take by mouth. CAPSULE     No current facility-administered medications for this visit.    Family History  Problem Relation Age of Onset  . Diabetes Mother   . Heart disease Mother     CHF  . Vision loss Mother   . Cancer Father     TESTICULAR  . Stroke Father 11  . Hyperlipidemia Father   . Hyperlipidemia Maternal Grandmother   . Heart disease Maternal Grandfather   . Diabetes Maternal Grandfather   . Anxiety disorder Brother   . Seizures Brother   . Cancer Paternal Grandmother   . Cancer Paternal Grandfather     ROS:  Pertinent items are noted in HPI.  Otherwise, a comprehensive ROS was negative.  Exam:   BP 110/58 mmHg  Pulse 66  Resp 24  Ht 5\' 5"  (1.651 m)  Wt 180 lb 6.4 oz (81.829 kg)  BMI 30.02 kg/m2  LMP 08/03/1998    General appearance: alert, cooperative and appears stated age Head: Normocephalic, without obvious abnormality, atraumatic Neck: no adenopathy, supple, symmetrical, trachea midline and  thyroid normal to inspection and palpation Lungs: clear to auscultation bilaterally Breasts: normal appearance, no masses or tenderness Heart: regular rate and rhythm Abdomen:  soft, non-tender; no masses, no organomegaly Extremities: extremities normal, atraumatic, no cyanosis or edema Skin: Skin color, texture, turgor normal.  Midline chest - 1.25 cm red raised lesion. Lymph nodes: Cervical, supraclavicular, and axillary nodes normal. No abnormal inguinal nodes palpated Neurologic: Grossly normal  Pelvic: External genitalia:  no lesions              Urethra:  normal appearing urethra with no masses, tenderness or lesions              Bartholins and Skenes: normal                 Vagina: normal appearing vagina with normal color and discharge, no lesions.  Second degree cystocele.              Cervix: no lesions               Pap taken: Yes.   Bimanual Exam:  Uterus:  normal size, contour, position, consistency, mobility, non-tender              Adnexa: no mass, fullness, tenderness              Rectal exam: Yes.  .  Confirms.              Anus:  normal sphincter tone, no lesions  Chaperone was present for exam.  Assessment:   Well woman visit with normal exam. Second degree rectocele.  Stable.  Skin change.  Plan: Yearly mammogram recommended after age 14.  Recommended self breast exam.  Pap and HR HPV as above. Discussed Calcium, Vitamin D, regular exercise program including cardiovascular and weight bearing exercise. Labs performed.  No..     Prescription medication(s) given.  No..   Continue stool softeners. She will call her dermatologist.  Follow up annually and prn.      After visit summary provided.

## 2016-01-24 NOTE — Patient Instructions (Signed)
Health Maintenance, Female Adopting a healthy lifestyle and getting preventive care can go a long way to promote health and wellness. Talk with your health care provider about what schedule of regular examinations is right for you. This is a good chance for you to check in with your provider about disease prevention and staying healthy. In between checkups, there are plenty of things you can do on your own. Experts have done a lot of research about which lifestyle changes and preventive measures are most likely to keep you healthy. Ask your health care provider for more information. WEIGHT AND DIET  Eat a healthy diet  Be sure to include plenty of vegetables, fruits, low-fat dairy products, and lean protein.  Do not eat a lot of foods high in solid fats, added sugars, or salt.  Get regular exercise. This is one of the most important things you can do for your health.  Most adults should exercise for at least 150 minutes each week. The exercise should increase your heart rate and make you sweat (moderate-intensity exercise).  Most adults should also do strengthening exercises at least twice a week. This is in addition to the moderate-intensity exercise.  Maintain a healthy weight  Body mass index (BMI) is a measurement that can be used to identify possible weight problems. It estimates body fat based on height and weight. Your health care provider can help determine your BMI and help you achieve or maintain a healthy weight.  For females 20 years of age and older:   A BMI below 18.5 is considered underweight.  A BMI of 18.5 to 24.9 is normal.  A BMI of 25 to 29.9 is considered overweight.  A BMI of 30 and above is considered obese.  Watch levels of cholesterol and blood lipids  You should start having your blood tested for lipids and cholesterol at 74 years of age, then have this test every 5 years.  You may need to have your cholesterol levels checked more often if:  Your lipid  or cholesterol levels are high.  You are older than 74 years of age.  You are at high risk for heart disease.  CANCER SCREENING   Lung Cancer  Lung cancer screening is recommended for adults 55-80 years old who are at high risk for lung cancer because of a history of smoking.  A yearly low-dose CT scan of the lungs is recommended for people who:  Currently smoke.  Have quit within the past 15 years.  Have at least a 30-pack-year history of smoking. A pack year is smoking an average of one pack of cigarettes a day for 1 year.  Yearly screening should continue until it has been 15 years since you quit.  Yearly screening should stop if you develop a health problem that would prevent you from having lung cancer treatment.  Breast Cancer  Practice breast self-awareness. This means understanding how your breasts normally appear and feel.  It also means doing regular breast self-exams. Let your health care provider know about any changes, no matter how small.  If you are in your 20s or 30s, you should have a clinical breast exam (CBE) by a health care provider every 1-3 years as part of a regular health exam.  If you are 40 or older, have a CBE every year. Also consider having a breast X-ray (mammogram) every year.  If you have a family history of breast cancer, talk to your health care provider about genetic screening.  If you   are at high risk for breast cancer, talk to your health care provider about having an MRI and a mammogram every year.  Breast cancer gene (BRCA) assessment is recommended for women who have family members with BRCA-related cancers. BRCA-related cancers include:  Breast.  Ovarian.  Tubal.  Peritoneal cancers.  Results of the assessment will determine the need for genetic counseling and BRCA1 and BRCA2 testing. Cervical Cancer Your health care provider may recommend that you be screened regularly for cancer of the pelvic organs (ovaries, uterus, and  vagina). This screening involves a pelvic examination, including checking for microscopic changes to the surface of your cervix (Pap test). You may be encouraged to have this screening done every 3 years, beginning at age 21.  For women ages 30-65, health care providers may recommend pelvic exams and Pap testing every 3 years, or they may recommend the Pap and pelvic exam, combined with testing for human papilloma virus (HPV), every 5 years. Some types of HPV increase your risk of cervical cancer. Testing for HPV may also be done on women of any age with unclear Pap test results.  Other health care providers may not recommend any screening for nonpregnant women who are considered low risk for pelvic cancer and who do not have symptoms. Ask your health care provider if a screening pelvic exam is right for you.  If you have had past treatment for cervical cancer or a condition that could lead to cancer, you need Pap tests and screening for cancer for at least 20 years after your treatment. If Pap tests have been discontinued, your risk factors (such as having a new sexual partner) need to be reassessed to determine if screening should resume. Some women have medical problems that increase the chance of getting cervical cancer. In these cases, your health care provider may recommend more frequent screening and Pap tests. Colorectal Cancer  This type of cancer can be detected and often prevented.  Routine colorectal cancer screening usually begins at 74 years of age and continues through 75 years of age.  Your health care provider may recommend screening at an earlier age if you have risk factors for colon cancer.  Your health care provider may also recommend using home test kits to check for hidden blood in the stool.  A small camera at the end of a tube can be used to examine your colon directly (sigmoidoscopy or colonoscopy). This is done to check for the earliest forms of colorectal  cancer.  Routine screening usually begins at age 50.  Direct examination of the colon should be repeated every 5-10 years through 75 years of age. However, you may need to be screened more often if early forms of precancerous polyps or small growths are found. Skin Cancer  Check your skin from head to toe regularly.  Tell your health care provider about any new moles or changes in moles, especially if there is a change in a mole's shape or color.  Also tell your health care provider if you have a mole that is larger than the size of a pencil eraser.  Always use sunscreen. Apply sunscreen liberally and repeatedly throughout the day.  Protect yourself by wearing long sleeves, pants, a wide-brimmed hat, and sunglasses whenever you are outside. HEART DISEASE, DIABETES, AND HIGH BLOOD PRESSURE   High blood pressure causes heart disease and increases the risk of stroke. High blood pressure is more likely to develop in:  People who have blood pressure in the high end   of the normal range (130-139/85-89 mm Hg).  People who are overweight or obese.  People who are African American.  If you are 38-23 years of age, have your blood pressure checked every 3-5 years. If you are 61 years of age or older, have your blood pressure checked every year. You should have your blood pressure measured twice--once when you are at a hospital or clinic, and once when you are not at a hospital or clinic. Record the average of the two measurements. To check your blood pressure when you are not at a hospital or clinic, you can use:  An automated blood pressure machine at a pharmacy.  A home blood pressure monitor.  If you are between 45 years and 39 years old, ask your health care provider if you should take aspirin to prevent strokes.  Have regular diabetes screenings. This involves taking a blood sample to check your fasting blood sugar level.  If you are at a normal weight and have a low risk for diabetes,  have this test once every three years after 74 years of age.  If you are overweight and have a high risk for diabetes, consider being tested at a younger age or more often. PREVENTING INFECTION  Hepatitis B  If you have a higher risk for hepatitis B, you should be screened for this virus. You are considered at high risk for hepatitis B if:  You were born in a country where hepatitis B is common. Ask your health care provider which countries are considered high risk.  Your parents were born in a high-risk country, and you have not been immunized against hepatitis B (hepatitis B vaccine).  You have HIV or AIDS.  You use needles to inject street drugs.  You live with someone who has hepatitis B.  You have had sex with someone who has hepatitis B.  You get hemodialysis treatment.  You take certain medicines for conditions, including cancer, organ transplantation, and autoimmune conditions. Hepatitis C  Blood testing is recommended for:  Everyone born from [birth year] through 1964.  Anyone with known risk factors for hepatitis C. Sexually transmitted infections (STIs)  You should be screened for sexually transmitted infections (STIs) including gonorrhea and chlamydia if:  You are sexually active and are younger than 74 years of age.  You are older than 74 years of age and your health care provider tells you that you are at risk for this type of infection.  Your sexual activity has changed since you were last screened and you are at an increased risk for chlamydia or gonorrhea. Ask your health care provider if you are at risk.  If you do not have HIV, but are at risk, it may be recommended that you take a prescription medicine daily to prevent HIV infection. This is called pre-exposure prophylaxis (PrEP). You are considered at risk if:  You are sexually active and do not regularly use condoms or know the HIV status of your partner(s).  You take drugs by injection.  You are sexually  active with a partner who has HIV. Talk with your health care provider about whether you are at high risk of being infected with HIV. If you choose to begin PrEP, you should first be tested for HIV. You should then be tested every 3 months for as long as you are taking PrEP.  PREGNANCY   If you are premenopausal and you may become pregnant, ask your health care provider about preconception counseling.  If you may  become pregnant, take 400 to 800 micrograms (mcg) of folic acid every day.  If you want to prevent pregnancy, talk to your health care provider about birth control (contraception). OSTEOPOROSIS AND MENOPAUSE   Osteoporosis is a disease in which the bones lose minerals and strength with aging. This can result in serious bone fractures. Your risk for osteoporosis can be identified using a bone density scan.  If you are 61 years of age or older, or if you are at risk for osteoporosis and fractures, ask your health care provider if you should be screened.  Ask your health care provider whether you should take a calcium or vitamin D supplement to lower your risk for osteoporosis.  Menopause may have certain physical symptoms and risks.  Hormone replacement therapy may reduce some of these symptoms and risks. Talk to your health care provider about whether hormone replacement therapy is right for you.  HOME CARE INSTRUCTIONS   Schedule regular health, dental, and eye exams.  Stay current with your immunizations.   Do not use any tobacco products including cigarettes, chewing tobacco, or electronic cigarettes.  If you are pregnant, do not drink alcohol.  If you are breastfeeding, limit how much and how often you drink alcohol.  Limit alcohol intake to no more than 1 drink per day for nonpregnant women. One drink equals 12 ounces of beer, 5 ounces of wine, or 1 ounces of hard liquor.  Do not use street drugs.  Do not share needles.  Ask your health care provider for help if  you need support or information about quitting drugs.  Tell your health care provider if you often feel depressed.  Tell your health care provider if you have ever been abused or do not feel safe at home.   This information is not intended to replace advice given to you by your health care provider. Make sure you discuss any questions you have with your health care provider.   Document Released: 02/02/2011 Document Revised: 08/10/2014 Document Reviewed: 06/21/2013 Elsevier Interactive Patient Education [employer].

## 2016-01-27 LAB — IPS PAP SMEAR ONLY

## 2016-02-25 ENCOUNTER — Other Ambulatory Visit: Payer: Self-pay | Admitting: Dermatology

## 2016-02-25 DIAGNOSIS — C44311 Basal cell carcinoma of skin of nose: Secondary | ICD-10-CM | POA: Diagnosis not present

## 2016-02-25 DIAGNOSIS — D485 Neoplasm of uncertain behavior of skin: Secondary | ICD-10-CM | POA: Diagnosis not present

## 2016-02-25 DIAGNOSIS — L821 Other seborrheic keratosis: Secondary | ICD-10-CM | POA: Diagnosis not present

## 2016-02-25 DIAGNOSIS — D239 Other benign neoplasm of skin, unspecified: Secondary | ICD-10-CM | POA: Diagnosis not present

## 2016-02-25 DIAGNOSIS — C4491 Basal cell carcinoma of skin, unspecified: Secondary | ICD-10-CM

## 2016-02-25 HISTORY — DX: Basal cell carcinoma of skin, unspecified: C44.91

## 2016-04-02 ENCOUNTER — Other Ambulatory Visit: Payer: Self-pay | Admitting: Dermatology

## 2016-04-02 DIAGNOSIS — L988 Other specified disorders of the skin and subcutaneous tissue: Secondary | ICD-10-CM | POA: Diagnosis not present

## 2016-04-02 DIAGNOSIS — C44311 Basal cell carcinoma of skin of nose: Secondary | ICD-10-CM | POA: Diagnosis not present

## 2016-05-13 DIAGNOSIS — H02831 Dermatochalasis of right upper eyelid: Secondary | ICD-10-CM | POA: Diagnosis not present

## 2016-05-13 DIAGNOSIS — H04123 Dry eye syndrome of bilateral lacrimal glands: Secondary | ICD-10-CM | POA: Diagnosis not present

## 2016-05-13 DIAGNOSIS — H43393 Other vitreous opacities, bilateral: Secondary | ICD-10-CM | POA: Diagnosis not present

## 2016-05-13 DIAGNOSIS — H02834 Dermatochalasis of left upper eyelid: Secondary | ICD-10-CM | POA: Diagnosis not present

## 2016-05-13 DIAGNOSIS — H2513 Age-related nuclear cataract, bilateral: Secondary | ICD-10-CM | POA: Diagnosis not present

## 2016-05-13 NOTE — Progress Notes (Signed)
Subjective:    Cynthia Castro is a 75 y.o. female who presents for Medicare Annual/Subsequent preventive examination.  Preventive Screening-Counseling & Management  Tobacco History  Smoking Status  . Never Smoker  Smokeless Tobacco  . Never Used     Problems Prior to Visit 1. Palpitations - work-up negative, resolved. 2. GERD - presents as LUQ tightness, improves with low acid diet, good posture, and drinking enough water. Resolves with prn peptobismol. Has had + FOBT several times in 2015. Last saw GI Dr. Michail Sermon in 2015.  She was just taking the zantac prn so has been having the bilateral upper quad tightness worse in left - feels like a clenching band. For the past 4d she has been compliant with her zantac with sig improvement in sxs. Usu occurs 2-3 hrs after eating.  She had a cholecystectomy in 2005  3. Arthralgias - prn diclofenac 4.  Basal cell carcinoma on nose removed last mo by derm Dr. Denna Haggard 5.  HPL - ASCVD risk increased last year as LDL increased from 118 to 169 when she had stopped trying to work on diet. She has worked some with less fatty meats and butter  Current Problems (verified) Patient Active Problem List   Diagnosis Date Noted  . GERD (gastroesophageal reflux disease) 07/11/2015  . DDD (degenerative disc disease), lumbar 11/24/2013  . Difficulty urinating 05/09/2013  . Rectocele 12/22/2012  . Diverticulosis of sigmoid colon 08/24/2012  . Seborrheic keratosis 06/27/2012  . Hypercholesteremia 04/21/2012  . Scoliosis of lumbar spine 04/21/2012  . Overweight (BMI 25.0-29.9) 04/21/2012    Medications Prior to Visit Current Outpatient Prescriptions on File Prior to Visit  Medication Sig Dispense Refill  . diclofenac (VOLTAREN) 75 MG EC tablet Take 1 tablet (75 mg total) by mouth 2 (two) times daily as needed for moderate pain. 60 tablet 5  . IBUPROFEN PO Take by mouth as needed.    Marland Kitchen MELATONIN PO Take by mouth as needed.    . ranitidine (ZANTAC) 150 MG  tablet TAKE 1 TABLET(150 MG) BY MOUTH TWICE DAILY (Patient taking differently: 1 tablet prn) 180 tablet 0  . TURMERIC PO Take by mouth. CAPSULE     No current facility-administered medications on file prior to visit.     Current Medications (verified) Current Outpatient Prescriptions  Medication Sig Dispense Refill  . diclofenac (VOLTAREN) 75 MG EC tablet Take 1 tablet (75 mg total) by mouth 2 (two) times daily as needed for moderate pain. 60 tablet 5  . IBUPROFEN PO Take by mouth as needed.    Marland Kitchen MELATONIN PO Take by mouth as needed.    . ranitidine (ZANTAC) 150 MG tablet TAKE 1 TABLET(150 MG) BY MOUTH TWICE DAILY (Patient taking differently: 1 tablet prn) 180 tablet 0  . TURMERIC PO Take by mouth. CAPSULE     No current facility-administered medications for this visit.      Allergies (verified) Review of patient's allergies indicates no known allergies.   PAST HISTORY  Family History Family History  Problem Relation Age of Onset  . Diabetes Mother   . Heart disease Mother     CHF  . Vision loss Mother   . Cancer Father     TESTICULAR  . Stroke Father 68  . Hyperlipidemia Father   . Hyperlipidemia Maternal Grandmother   . Heart disease Maternal Grandfather   . Diabetes Maternal Grandfather   . Anxiety disorder Brother   . Seizures Brother   . Cancer Paternal Grandmother   .  Cancer Paternal Grandfather     Social History Social History  Substance Use Topics  . Smoking status: Never Smoker  . Smokeless tobacco: Never Used  . Alcohol use Yes     Comment: WINE OR BEER - 1 OR 2/month     Are there smokers in your home? No - divorced, lives alone, pt has no smoking hx  Risk Factors Current exercise habits: Gym/ health club routine includes see below.  Dietary issues discussed: generally healthy Pt practices healthy lifestyle w/ nutrition, yoga, walking, rides bike, and aquatic exercise at [facility] weekly. Drinks milk regularly - 1/2 gallon/wk, no exogenous Ca/Vit D  supp. No omega-3 supp but she is going restart on recommendation of her eye doctor.  Cardiac risk factors: advanced age (older than 24 for men, 12 for women) and dyslipidemia.  Depression Screen Depression screen [facility] 2/9 05/14/2016 01/08/2016 07/08/2015 05/09/2015 03/04/2015  Decreased Interest 0 0 0 0 0  Down, Depressed, Hopeless 0 0 0 0 0  PHQ - 2 Score 0 0 0 0 0    (Note: if answer to either of the following is "Yes", a more complete depression screening is indicated)   Over the past two weeks, have you felt down, depressed or hopeless? No  Over the past two weeks, have you felt little interest or pleasure in doing things? No  Have you lost interest or pleasure in daily life? No  Do you often feel hopeless? No  Do you cry easily over simple problems? No  Activities of Daily Living Her neck pain is getting worse. She is using the diclofenac almost daily now. In your present state of health, do you have any difficulty performing the following activities?:  Driving? No Managing money?  No Feeding yourself? No Getting from bed to chair? No Climbing a flight of stairs? No Preparing food and eating?: No Bathing or showering? No Getting dressed: No Getting to the toilet? No Using the toilet:No Moving around from place to place: No In the past year have you fallen or had a near fall?:No   Are you sexually active?  Yes  Do you have more than one partner?  No  Hearing Difficulties: No Do you often ask people to speak up or repeat themselves? No Do you experience ringing or noises in your ears? No Do you have difficulty understanding soft or whispered voices? No   Do you feel that you have a problem with memory? No  Do you often misplace items? No  Do you feel safe at home?  Yes  Cognitive Testing  Alert? Yes  Normal Appearance?Yes  Oriented to person? Yes  Place? Yes   Time? Yes  Recall of three objects?  Yes  Can perform simple calculations? Yes  Displays appropriate  judgment?Yes  Can read the correct time from a watch face?Yes   Advanced Directives have been discussed with the patient? Yes - Does have end-of-life planning done.  Does not want to be a vegetable. Would be ok with CPR at this point in her life but would change her mind if she had a disease with a poor prognosis.  List the Names of Other Physician/Practitioners you currently use: 1.  Dr. Josefa Half, gyn - sees annually for well-woman care 2.  GI - Dr. Michail Sermon 3.  Urology - Dr. Janice Norrie (h/o kidney stone) 4.  Optho - Dr. Katy Fitch 5.  Dentist - Dr. Bevelyn Buckles 6.  Dermatology - [facility] Dr. Wilburn Cornelia any recent Medical Services you  may have received from other than [facility] providers in the past year (date may be approximate).  Immunization History  Administered Date(s) Administered  . Influenza,inj,Quad PF,36+ Mos 05/05/2013, 05/08/2014, 05/09/2015  . Pneumococcal Conjugate-13 05/09/2015  . Pneumococcal Polysaccharide-23 04/22/2007  . Td 03/02/2002  . Tdap 12/22/2012    Screening Tests Health Maintenance  Topic Date Due  . INFLUENZA VACCINE  03/03/2016  . MAMMOGRAM  07/30/2016  . COLONOSCOPY  01/13/2022  . TETANUS/TDAP  12/23/2022  . DEXA SCAN  Completed  . ZOSTAVAX  Addressed  . PNA vac Low Risk Adult  Completed    All answers were reviewed with the patient and necessary referrals were made:  SHAW,EVA, MD   05/13/2016   History reviewed: allergies, current medications, past family history, past medical history, past social history, past surgical history and problem list  Review of Systems Pertinent items noted in HPI and remainder of comprehensive ROS otherwise negative.    Objective:  BP 108/60 (BP Location: Left Arm, Patient Position: Sitting, Cuff Size: Large)   Pulse 64   Temp 98.4 F (36.9 C) (Oral)   Resp 16   Ht 5' 5.25" (1.657 m)   Wt 170 lb (77.1 kg)   LMP 08/03/1998   SpO2 94%   BMI 28.07 kg/m   Visual Acuity Screening   Right eye Left eye Both eyes   Without correction:     With correction: 20/20 20/20 20/20     BP 108/60 (BP Location: Left Arm, Patient Position: Sitting, Cuff Size: Large)   Pulse 64   Temp 98.4 F (36.9 C) (Oral)   Resp 16   Ht 5' 5.25" (1.657 m)   Wt 170 lb (77.1 kg)   LMP 08/03/1998   SpO2 94%   BMI 28.07 kg/m   General Appearance:    Alert, cooperative, no distress, appears stated age  Head:    Normocephalic, without obvious abnormality, atraumatic  Eyes:    PERRL, conjunctiva/corneas clear, EOM's intact, fundi    benign, both eyes  Ears:    Normal TM's and external ear canals, both ears  Nose:   Nares normal, septum midline, mucosa normal, no drainage    or sinus tenderness  Throat:   Lips, mucosa, and tongue normal; teeth and gums normal  Neck:   Supple, symmetrical, trachea midline, no adenopathy;    thyroid:  no enlargement/tenderness/nodules; no carotid   bruit or JVD  Back:     Symmetric, no curvature, ROM normal, no CVA tenderness  Lungs:     Clear to auscultation bilaterally, respirations unlabored  Chest Wall:    No tenderness or deformity   Heart:    Regular rate and rhythm, S1 and S2 normal, no murmur, rub   or gallop  Breast Exam:    No tenderness, masses, or nipple abnormality  Abdomen:     Soft, non-tender, bowel sounds active all four quadrants,    no masses, no organomegaly        Extremities:   Extremities normal, atraumatic, no cyanosis or edema  Pulses:   2+ and symmetric all extremities  Skin:   Skin color, texture, turgor normal, no rashes or lesions  Lymph nodes:   Cervical, supraclavicular, and axillary nodes normal  Neurologic:   CNII-XII intact, normal strength, sensation and reflexes    throughout        Assessment:      1. Medicare annual wellness visit, subsequent   2. Screening for cardiovascular, respiratory, and genitourinary diseases   3.  Gastroesophageal reflux disease, esophagitis presence not specified   4. DDD (degenerative disc disease), lumbar   5.  Overweight (BMI 25.0-29.9)   6. Hypercholesteremia   7. Need for influenza vaccination         Plan:  Ua, lipid Flu shot   During the course of the visit the patient was educated and counseled about appropriate screening and preventive services including:     Mammogram - UTD, done at her gynecologist Dr. Josefa Half every other year as no sig personal or fam hx of breast cancer. Pelvic - sees gyn Dr. Quincy Simmonds annually - had pap this yr, nml, she was advised to repeat in 5 years. No h/o abnml paps. CRS - Colonoscopy was done 2013 with repeat in 10 years. However, pt did has a + IFOBT 11/24/13 and 12/04/13 - did she due GI f/u in 2015 Immunizations: Zostavax done 2015, tdap 12/2012, annual flu shot, pneumo 23 2008, Prevnar-13 2016 Vision- sees Dr. Katy Fitch regularly DEXA- Pt declines ; last 2 studies have been normal - done in 5/09, 3/08, 6/04. Pt is doing weight bearing exercise EKG: NSR 01/08/16   Labs: cmp nml 01/08/16 a1c nml 5.4 10/2014 w/ all gluc <100 since Lipid: T chol 223->190 -> 249, trig 220->118->154, hdl 50-> 48 -> 49, ldl 129 -> 118 ->169. Last checked 1 yr prior 05/09/15 Tsh nml 01/08/16 Cbc nml 01/08/16 Vit D nml @ 43 05/2013  Diet review for nutrition referral? Yes ____  Not Indicated _x___   Patient Instructions (the written plan) was given to the patient.  Medicare Attestation I have personally reviewed: The patient's medical and social history Their use of alcohol, tobacco or illicit drugs Their current medications and supplements The patient's functional ability including ADLs,fall risks, home safety risks, cognitive, and hearing and visual impairment Diet and physical activities Evidence for depression or mood disorders  The patient's weight, height, BMI, and visual acuity have been recorded in the chart.  I have made referrals, counseling, and provided education to the patient based on review of the above and I have provided the patient with a written personalized  care plan for preventive services.     [name], MD   05/13/2016      Orders Placed This Encounter  Procedures  . Flu Vaccine QUAD 36+ mos IM  . Lipid panel    Order Specific Question:   Has the patient fasted?    Answer:   Yes  . POCT urinalysis dipstick    Meds ordered this encounter  Medications  . omeprazole (PRILOSEC) 40 MG capsule    Sig: Take 1 capsule (40 mg total) by mouth daily.    Dispense:  30 capsule    Refill:  3     [name], M.D.  [facility] [address] [place], [place] [address] [phone] phone [phone] fax  06/05/16 8:14 AM

## 2016-05-14 ENCOUNTER — Ambulatory Visit (INDEPENDENT_AMBULATORY_CARE_PROVIDER_SITE_OTHER): Payer: Medicare Other | Admitting: Family Medicine

## 2016-05-14 ENCOUNTER — Encounter: Payer: Self-pay | Admitting: Family Medicine

## 2016-05-14 VITALS — BP 108/60 | HR 64 | Temp 98.4°F | Resp 16 | Ht 65.25 in | Wt 170.0 lb

## 2016-05-14 DIAGNOSIS — E7800 Pure hypercholesterolemia, unspecified: Secondary | ICD-10-CM

## 2016-05-14 DIAGNOSIS — M5136 Other intervertebral disc degeneration, lumbar region: Secondary | ICD-10-CM

## 2016-05-14 DIAGNOSIS — Z23 Encounter for immunization: Secondary | ICD-10-CM

## 2016-05-14 DIAGNOSIS — Z136 Encounter for screening for cardiovascular disorders: Secondary | ICD-10-CM

## 2016-05-14 DIAGNOSIS — Z0000 Encounter for general adult medical examination without abnormal findings: Secondary | ICD-10-CM

## 2016-05-14 DIAGNOSIS — Z1383 Encounter for screening for respiratory disorder NEC: Secondary | ICD-10-CM

## 2016-05-14 DIAGNOSIS — K219 Gastro-esophageal reflux disease without esophagitis: Secondary | ICD-10-CM | POA: Diagnosis not present

## 2016-05-14 DIAGNOSIS — Z1389 Encounter for screening for other disorder: Secondary | ICD-10-CM | POA: Diagnosis not present

## 2016-05-14 DIAGNOSIS — E663 Overweight: Secondary | ICD-10-CM | POA: Diagnosis not present

## 2016-05-14 LAB — LIPID PANEL
CHOL/HDL RATIO: 5 ratio (ref <=5.0)
CHOLESTEROL: 223 mg/dL — AB (ref 125–200)
HDL: 45 mg/dL — ABNORMAL LOW (ref >=46)
LDL Cholesterol: 149 mg/dL — ABNORMAL HIGH (ref <130)
TRIGLYCERIDES: 143 mg/dL (ref <150)
VLDL: 29 mg/dL (ref <30)

## 2016-05-14 LAB — POCT URINALYSIS DIP (MANUAL ENTRY)
Bilirubin, UA: NEGATIVE
GLUCOSE UA: NEGATIVE
Ketones, POC UA: NEGATIVE
Leukocytes, UA: NEGATIVE
Nitrite, UA: NEGATIVE
Protein Ur, POC: NEGATIVE
SPEC GRAV UA: 1.025
UROBILINOGEN UA: 0.2
pH, UA: 6

## 2016-05-14 MED ORDER — OMEPRAZOLE 40 MG PO CPDR: 40.0000 mg | DELAYED_RELEASE_CAPSULE | Freq: Every day | ORAL | 3 refills | Status: DC

## 2016-05-14 NOTE — Patient Instructions (Signed)
     IF you received an x-ray today, you will receive an invoice from [facility]. Please contact [facility] at [phone] with questions or concerns regarding your invoice.   IF you received labwork today, you will receive an invoice from [facility]. Please contact [facility] at [phone] with questions or concerns regarding your invoice.   Our billing staff will not be able to assist you with questions regarding bills from these companies.  You will be contacted with the lab results as soon as they are available. The fastest way to get your results is to activate your My Chart account. Instructions are located on the last page of this paperwork. If you have not heard from us regarding the results in 2 weeks, please contact this office.      

## 2016-08-12 ENCOUNTER — Other Ambulatory Visit: Payer: Self-pay | Admitting: Family Medicine

## 2016-08-16 NOTE — Telephone Encounter (Signed)
05/2016 last ov 10/16 last refill

## 2016-11-07 ENCOUNTER — Other Ambulatory Visit: Payer: Self-pay | Admitting: Family Medicine

## 2016-11-07 NOTE — Telephone Encounter (Signed)
08/16/16 last refill

## 2017-02-12 ENCOUNTER — Encounter: Payer: Self-pay | Admitting: Obstetrics and Gynecology

## 2017-02-12 ENCOUNTER — Other Ambulatory Visit: Payer: Self-pay | Admitting: Family Medicine

## 2017-02-12 ENCOUNTER — Ambulatory Visit (INDEPENDENT_AMBULATORY_CARE_PROVIDER_SITE_OTHER): Payer: Medicare Other | Admitting: Obstetrics and Gynecology

## 2017-02-12 VITALS — BP 112/70 | HR 68 | Resp 16 | Ht 65.5 in | Wt 176.0 lb

## 2017-02-12 DIAGNOSIS — Z1231 Encounter for screening mammogram for malignant neoplasm of breast: Secondary | ICD-10-CM

## 2017-02-12 DIAGNOSIS — Z01419 Encounter for gynecological examination (general) (routine) without abnormal findings: Secondary | ICD-10-CM | POA: Diagnosis not present

## 2017-02-12 NOTE — Patient Instructions (Signed)

## 2017-02-12 NOTE — Progress Notes (Signed)
76 y.o. G56P1103 Divorced Caucasian female here for annual exam.    Some muscle and joint pain.   Night urination.  Drinks a lot of water.  No significant urgency.  No urinary incontinence.   Rectocele is not problematic. Using stool softener occasionally.   No vaginal bleeding.  Not sexually active.   Trying to loose weight.   PCP:   [name], MD  Patient's last menstrual period was 08/03/1998.           Sexually active: No.  The current method of family planning is post menopausal status.    Exercising: Yes.    water aerobics and walking Smoker:  no  Health Maintenance: Pap:  01/24/16 Pap smear negative History of abnormal Pap:  no MMG:  08-01-15 Diag.Bil/3D/Density B/Neg/BiRads1:[facility].  Had ultrasound done of the right breat due to pain, and this was normal.  Colonoscopy:  01/2011 normal with Dr. Michail Sermon BMD:   2009  Result  normal TDaP:  12/22/12 Screening Labs:  PCP takes care of labs   reports that she has never smoked. She has never used smokeless tobacco. She reports that she drinks alcohol. She reports that she does not use drugs.  Past Medical History:  Diagnosis Date  . Arthritis   . DDD (degenerative disc disease), cervical   . History of kidney stones 05/2013  . Hyperlipidemia   . Reflux     Past Surgical History:  Procedure Laterality Date  . CESAREAN SECTION  1984  . CHOLECYSTECTOMY  2005  . CYSTECTOMY  2003  . hysteroscopic resection     hysteroscopic resection of polyp  . LITHOTRIPSY  04/2013   --Dr. Janice Norrie  . UTERINE CYST REMOVAL  2005    Current Outpatient Prescriptions  Medication Sig Dispense Refill  . b complex vitamins tablet Take 1 tablet by mouth daily.    . diclofenac (VOLTAREN) 75 MG EC tablet TAKE 1 TABLET(75 MG) BY MOUTH TWICE DAILY AS NEEDED FOR MODERATE PAIN 60 tablet 1  . IBUPROFEN PO Take by mouth as needed.    Marland Kitchen MELATONIN PO Take by mouth as needed.    . Omega-3 Fatty Acids (FISH OIL PO) Take by mouth daily.     . TURMERIC PO Take by mouth. CAPSULE     No current facility-administered medications for this visit.     Family History  Problem Relation Age of Onset  . Diabetes Mother   . Heart disease Mother        CHF  . Vision loss Mother   . Cancer Father        TESTICULAR  . Stroke Father 24  . Hyperlipidemia Father   . Hyperlipidemia Maternal Grandmother   . Diabetes Maternal Grandmother   . Heart attack Maternal Grandfather   . Cancer Paternal Grandmother   . Cancer Paternal Grandfather     ROS:  Pertinent items are noted in HPI.  Otherwise, a comprehensive ROS was negative.  Exam:   BP 112/70 (BP Location: Right Arm, Patient Position: Sitting, Cuff Size: Normal)   Pulse 68   Resp 16   Ht 5' 5.5" (1.664 m)   Wt 176 lb (79.8 kg)   LMP 08/03/1998   BMI 28.84 kg/m     General appearance: alert, cooperative and appears stated age Head: Normocephalic, without obvious abnormality, atraumatic Neck: no adenopathy, supple, symmetrical, trachea midline and thyroid normal to inspection and palpation Lungs: clear to auscultation bilaterally Breasts: normal appearance, no masses or tenderness, No nipple  retraction or dimpling, No nipple discharge or bleeding, No axillary or supraclavicular adenopathy Heart: regular rate and rhythm Abdomen: soft, non-tender; no masses, no organomegaly Extremities: extremities normal, atraumatic, no cyanosis or edema Skin: Skin color, texture, turgor normal. No rashes or lesions Lymph nodes: Cervical, supraclavicular, and axillary nodes normal. No abnormal inguinal nodes palpated Neurologic: Grossly normal  Pelvic: External genitalia:  no lesions              Urethra:  normal appearing urethra with no masses, tenderness or lesions              Bartholins and Skenes: normal                 Vagina: normal appearing vagina with normal color and discharge, no lesions.  Second degree rectocele.              Cervix: no lesions              Pap taken:  No. Bimanual Exam:  Uterus:  normal size, contour, position, consistency, mobility, non-tender              Adnexa: no mass, fullness, tenderness              Rectal exam: Yes.  .  Confirms.  Second degree high rectocele.               Anus:  normal sphincter tone, no lesions  Chaperone was present for exam.  Assessment:   Well woman visit with normal exam. Second degree rectocele.  Stable.   Plan: Mammogram screening discussed. Recommended self breast awareness. Pap and HR HPV as above. Guidelines for Calcium, Vitamin D, regular exercise program including cardiovascular and weight bearing exercise. Discussed [employer] wellness programs for healthy lifestyle and weight loss.  Labs with PCP.  Follow up annually and prn.   After visit summary provided.

## 2017-03-02 ENCOUNTER — Ambulatory Visit: Payer: Self-pay

## 2017-03-09 DIAGNOSIS — H2513 Age-related nuclear cataract, bilateral: Secondary | ICD-10-CM | POA: Diagnosis not present

## 2017-03-09 DIAGNOSIS — H43813 Vitreous degeneration, bilateral: Secondary | ICD-10-CM | POA: Diagnosis not present

## 2017-03-09 DIAGNOSIS — H02834 Dermatochalasis of left upper eyelid: Secondary | ICD-10-CM | POA: Diagnosis not present

## 2017-03-09 DIAGNOSIS — H4322 Crystalline deposits in vitreous body, left eye: Secondary | ICD-10-CM | POA: Diagnosis not present

## 2017-03-09 DIAGNOSIS — H02831 Dermatochalasis of right upper eyelid: Secondary | ICD-10-CM | POA: Diagnosis not present

## 2017-04-07 DIAGNOSIS — H4322 Crystalline deposits in vitreous body, left eye: Secondary | ICD-10-CM | POA: Diagnosis not present

## 2017-04-07 DIAGNOSIS — H02834 Dermatochalasis of left upper eyelid: Secondary | ICD-10-CM | POA: Diagnosis not present

## 2017-04-07 DIAGNOSIS — H02831 Dermatochalasis of right upper eyelid: Secondary | ICD-10-CM | POA: Diagnosis not present

## 2017-04-07 DIAGNOSIS — H43813 Vitreous degeneration, bilateral: Secondary | ICD-10-CM | POA: Diagnosis not present

## 2017-04-07 DIAGNOSIS — H2513 Age-related nuclear cataract, bilateral: Secondary | ICD-10-CM | POA: Diagnosis not present

## 2017-04-15 ENCOUNTER — Telehealth: Payer: Self-pay

## 2017-04-15 ENCOUNTER — Encounter: Payer: Self-pay | Admitting: Family Medicine

## 2017-04-15 NOTE — Telephone Encounter (Signed)
Called pt to schedule Medicare Annual Wellness Visit. Discussed with pt that her upcoming CPE appointment needed to be rescheduled with Dr. Brigitte Pulse. We rescheduled that appointment and she preferred to just have her physical with PCP at this time but may consider AWV with Nurse Health Advisor in the future.    Josepha Pigg, B.A.  Care Guide [phone]

## 2017-05-11 DIAGNOSIS — M2550 Pain in unspecified joint: Secondary | ICD-10-CM | POA: Insufficient documentation

## 2017-05-11 NOTE — Progress Notes (Deleted)
   Subjective:    Patient ID: Cynthia Castro, female    DOB: 11-16-1940, 76 y.o.   MRN: [id number]  HPI  Cynthia Castro is a delightful 76 yo woman who is here today for her complete physical. I last saw her 1 yr prev for the same.  Primary Preventative Screenings: Cervical Cancer: GYN care by Dr. Quincy Simmonds - seen 02/12/17.  01/24/16 Pap smear negative w/ no h/o abnml. STI screening: not sexually active, sees gyn. Breast Cancer: diagnostic mam and [place] 07/31/15 -> fibroglandular tissue, due for screening mam Colorectal Cancer: Colonoscopy 01/2012 w/ GI Dr. Michail Sermon -> 10 yr recall but may age out Tobacco use/EtOH/substances: No smoking hx. Rare soc EtOH. Bone Density: do not have results. nml per gyn notes. Pt declines to repeat as reports all prior studies have been normal - done in 5/09, 3/08, 6/04. Pt is doing weight bearing exercise and drinking milk.  Cardiac: EKG 01/08/2016 Weight/Blood sugar/Diet/Exercise: overweight - would like to loose, exercising with water aerobics at [facility] and walking. Yoga, rides bike.  Generally healthy diet. a1c nml 5.4 10/2014 w/ all gluc <100 since BMI Readings from Last 3 Encounters:  02/12/17 28.84 kg/m  05/14/16 28.07 kg/m  01/24/16 30.02 kg/m   Lab Results  Component Value Date   HGBA1C 5.4 10/26/2014   OTC/Vit/Supp/Herbal: Drinks milk regularly - 1/2 gallon/wk, no exogenous Ca/Vit D supp. Vit D nml @ 43 05/2013.  Was planning to start an omega-3 supp on rec of ophtho Dentist/Optho: ophtho Dr. Katy Fitch, Dr. Bevelyn Buckles dentist, both seen reg. Immunizations: Zostavax done 2015 Immunization History  Administered Date(s) Administered  . Influenza,inj,Quad PF,6+ Mos 05/05/2013, 05/08/2014, 05/09/2015, 05/14/2016  . Pneumococcal Conjugate-13 05/09/2015  . Pneumococcal Polysaccharide-23 04/22/2007  . Td 03/02/2002  . Tdap 12/22/2012   Derm: Dr. Denna Haggard  Chronic Medical Conditions: GERD - presents as LUQ tightness 2-3 hrs after eating, improves with low acid diet, good  posture, and drinking enough water. Resolves with prn peptobismol and scheduled ranitidine. Has had + FOBT several times in 2015 - saw GI Dr. Michail Sermon at that time.  s/p cholecystectomy in 2005   Arthralgias - prn diclofenac  HLD -  When she had stopped trying to work on diet, LDL increased from 118 to 169. Tried to cut out butter and fatty means and LDL decreased to 149 (last yr). T chol 223->190 -> 249 ->223, trig 220->118->154 ->143, hdl 50-> 48 -> 49 ->45, ldl 129 -> 118 ->169 ->149.  Last yr ASCVD risk 10.8% due to age as OPTIMAL risk is 10.7%.  Overweight: trying to loose   Divorced, lives alone.  Review of Systems     Objective:   Physical Exam        Assessment & Plan:  Sched AWV w/ [facility] Ua, cmp, lipid, cbc (tsh nml 01/2016) Flu shot, Shingrix

## 2017-05-13 ENCOUNTER — Encounter: Payer: Medicare Other | Admitting: Family Medicine

## 2017-05-24 ENCOUNTER — Encounter: Payer: Self-pay | Admitting: Family Medicine

## 2017-06-15 NOTE — Progress Notes (Deleted)
Subjective:    Patient ID: Cynthia Castro, female    DOB: 01/20/1941, 76 y.o.   MRN: [id number]  HPI  Cynthia Castro is a delightful 76 yo woman who is here today for her complete physical. I last saw her 1 yr prev for the same.  Primary Preventative Screenings: Cervical Cancer: GYN care by Dr. Quincy Simmonds - seen 02/12/17. 01/24/16 Pap smear negative w/ no h/o abnml. STI screening: not sexually active, sees gyn. Breast Cancer: diagnostic mam and [place] 07/31/15 -> fibroglandular tissue, due for screening mam Colorectal Cancer: Colonoscopy 01/2012 w/ GI Dr. Michail Sermon -> 10 yr recall but may age out Tobacco use/EtOH/substances: No smoking hx. Rare soc EtOH. Bone Density: do not have results. nml per gyn notes. Pt declines to repeat as reports all prior studies have been normal - done in5/09, 3/08, 6/04. Pt is doingweight bearing exercise and drinking milk. Drinks milk regularly - 1/2 gallon/wk, no exogenous Ca/Vit D supp. Vit D nml @ 43 05/2013.   Cardiac: EKG 01/08/2016 Weight/Blood sugar/Diet/Exercise: overweight - would like to loose, exercising with water aerobics at [facility] and walking. Yoga, rides bike.  Generally healthy diet. a1c nml 5.4 10/2014 w/ all gluc <100 since    BMI Readings from Last 3 Encounters:  02/12/17 28.84 kg/m  05/14/16 28.07 kg/m  01/24/16 30.02 kg/m   RecentLabs       Lab Results  Component Value Date   HGBA1C 5.4 10/26/2014     OTC/Vit/Supp/Herbal: vit B complex, melatonin, turmeric, omega-3 supp Dentist/Optho: ophtho Dr. Katy Fitch, Dr. Bevelyn Buckles dentist, both seen reg. Immunizations: Zostavax done 2015     Immunization History  Administered Date(s) Administered  . Influenza,inj,Quad PF,6+ Mos 05/05/2013, 05/08/2014, 05/09/2015, 05/14/2016  . Pneumococcal Conjugate-13 05/09/2015  . Pneumococcal Polysaccharide-23 04/22/2007  . Td 03/02/2002  . Tdap 12/22/2012   Derm: Dr. Denna Haggard  Chronic Medical Conditions: GERD - presents as LUQ tightness 2-3 hrs after eating,  improves with low acid diet, good posture, and drinking enough water. Resolves with prn peptobismol and scheduled ranitidine. Has had + FOBT several times in 2015 - saw GI Dr. Michail Sermon at that time.  s/p cholecystectomy in 2005   Arthralgias - prn diclofenac  HLD -  When she had stopped trying to work on diet, LDL increased from 118 to 169. Tried to cut out butter and fatty means and LDL decreased to 149 (last yr).  Last yr ASCVD risk 10.8% due to age as OPTIMAL risk is 10.7%. T chol 223->190 -> 249 ->223 trig      220->118->154 -> 143 hdl      50->  48 ->  49 ->   45 ldl       129 -> 118 ->169 ->149.    Overweight: trying to loose   Divorced, lives alone.       Review of Systems     Objective:   Physical Exam    BP 110/76   Pulse (!) 57   Temp 98.3 F (36.8 C)   Resp 16   Ht 5' 5.5" (1.664 m)   Wt 172 lb 9.6 oz (78.3 kg)   LMP 08/03/1998   SpO2 96%   BMI 28.29 kg/m      Assessment & Plan:  Sched AWV w/ [facility] (already has active order) Ua, cmp, lipid, cbc (tsh nml 01/2016) Flu shot, Shingrix  1. Annual physical exam   2. Encounter for screening mammogram for breast cancer   3. Need for prophylactic vaccination and inoculation  against influenza   4. Gastroesophageal reflux disease, esophagitis presence not specified   5. Arthralgia of multiple joints   6. Hypercholesteremia   7. Overweight (BMI 25.0-29.9)   8. Medication monitoring encounter     Orders Placed This Encounter  Procedures  . Comprehensive metabolic panel    Order Specific Question:   Has the patient fasted?    Answer:   Yes  . CBC with Differential/Platelet  . Lipid panel    Order Specific Question:   Has the patient fasted?    Answer:   Yes    Meds ordered this encounter  Medications  . diphenhydrAMINE (BENADRYL) 25 MG tablet    Sig: Take 25 mg at bedtime as needed by mouth for sleep.  Marland Kitchen DISCONTD: indomethacin (INDOCIN) 50 MG capsule    Sig: Take 1 capsule (50 mg  total) 3 (three) times daily as needed by mouth.    Dispense:  60 capsule    Refill:  5  . indomethacin (INDOCIN) 50 MG capsule    Sig: Take 1 capsule (50 mg total) 3 (three) times daily as needed by mouth.    Dispense:  60 capsule    Refill:  5    I personally performed the services described in this documentation, which was scribed in my presence. The recorded information has been reviewed and considered, and addended by me as needed.   [name], M.D.  [facility] at [facility] [address] [place], [place] [address] [phone] phone [phone] fax  06/19/17 6:15 AM

## 2017-06-16 ENCOUNTER — Ambulatory Visit (INDEPENDENT_AMBULATORY_CARE_PROVIDER_SITE_OTHER): Payer: Medicare Other | Admitting: Family Medicine

## 2017-06-16 ENCOUNTER — Encounter: Payer: Self-pay | Admitting: Family Medicine

## 2017-06-16 ENCOUNTER — Other Ambulatory Visit: Payer: Self-pay

## 2017-06-16 VITALS — BP 110/76 | HR 57 | Temp 98.3°F | Resp 16 | Ht 65.5 in | Wt 172.6 lb

## 2017-06-16 DIAGNOSIS — E7800 Pure hypercholesterolemia, unspecified: Secondary | ICD-10-CM

## 2017-06-16 DIAGNOSIS — Z1231 Encounter for screening mammogram for malignant neoplasm of breast: Secondary | ICD-10-CM | POA: Diagnosis not present

## 2017-06-16 DIAGNOSIS — E663 Overweight: Secondary | ICD-10-CM

## 2017-06-16 DIAGNOSIS — Z23 Encounter for immunization: Secondary | ICD-10-CM

## 2017-06-16 DIAGNOSIS — Z0000 Encounter for general adult medical examination without abnormal findings: Secondary | ICD-10-CM

## 2017-06-16 DIAGNOSIS — M2550 Pain in unspecified joint: Secondary | ICD-10-CM

## 2017-06-16 DIAGNOSIS — Z5181 Encounter for therapeutic drug level monitoring: Secondary | ICD-10-CM

## 2017-06-16 DIAGNOSIS — K219 Gastro-esophageal reflux disease without esophagitis: Secondary | ICD-10-CM | POA: Diagnosis not present

## 2017-06-16 LAB — CBC WITH DIFFERENTIAL/PLATELET
BASOS ABS: 0.1 10*3/uL (ref 0.0–0.2)
BASOS: 1 %
EOS (ABSOLUTE): 0.3 10*3/uL (ref 0.0–0.4)
Eos: 5 %
Hematocrit: 47.8 % — ABNORMAL HIGH (ref 34.0–46.6)
Hemoglobin: 15.5 g/dL (ref 11.1–15.9)
IMMATURE GRANS (ABS): 0 10*3/uL (ref 0.0–0.1)
Immature Granulocytes: 0 %
LYMPHS ABS: 2.7 10*3/uL (ref 0.7–3.1)
Lymphs: 48 %
MCH: 29.5 pg (ref 26.6–33.0)
MCHC: 32.4 g/dL (ref 31.5–35.7)
MCV: 91 fL (ref 79–97)
MONOS ABS: 0.4 10*3/uL (ref 0.1–0.9)
Monocytes: 8 %
NEUTROS ABS: 2.1 10*3/uL (ref 1.4–7.0)
Neutrophils: 38 %
PLATELETS: 287 10*3/uL (ref 150–379)
RBC: 5.26 x10E6/uL (ref 3.77–5.28)
RDW: 13.9 % (ref 12.3–15.4)
WBC: 5.6 10*3/uL (ref 3.4–10.8)

## 2017-06-16 LAB — LIPID PANEL
CHOL/HDL RATIO: 4.9 ratio — AB (ref 0.0–4.4)
CHOLESTEROL TOTAL: 255 mg/dL — AB (ref 100–199)
HDL: 52 mg/dL (ref >39)
LDL CALC: 175 mg/dL — AB (ref 0–99)
Triglycerides: 140 mg/dL (ref 0–149)
VLDL CHOLESTEROL CAL: 28 mg/dL (ref 5–40)

## 2017-06-16 LAB — COMPREHENSIVE METABOLIC PANEL
A/G RATIO: 1.8 (ref 1.2–2.2)
ALT: 20 IU/L (ref 0–32)
AST: 19 IU/L (ref 0–40)
Albumin: 4.6 g/dL (ref 3.5–4.8)
Alkaline Phosphatase: 92 IU/L (ref 39–117)
BILIRUBIN TOTAL: 0.7 mg/dL (ref 0.0–1.2)
BUN / CREAT RATIO: 27 (ref 12–28)
BUN: 24 mg/dL (ref 8–27)
CALCIUM: 9.4 mg/dL (ref 8.7–10.3)
CHLORIDE: 104 mmol/L (ref 96–106)
CO2: 23 mmol/L (ref 20–29)
Creatinine, Ser: 0.9 mg/dL (ref 0.57–1.00)
GFR, EST AFRICAN AMERICAN: 72 mL/min/{1.73_m2} (ref >59)
GFR, EST NON AFRICAN AMERICAN: 63 mL/min/{1.73_m2} (ref >59)
GLOBULIN, TOTAL: 2.6 g/dL (ref 1.5–4.5)
Glucose: 86 mg/dL (ref 65–99)
POTASSIUM: 4.5 mmol/L (ref 3.5–5.2)
SODIUM: 141 mmol/L (ref 134–144)
TOTAL PROTEIN: 7.2 g/dL (ref 6.0–8.5)

## 2017-06-16 MED ORDER — INDOMETHACIN 50 MG PO CAPS: 50.0000 mg | ORAL_CAPSULE | Freq: Three times a day (TID) | ORAL | 5 refills | Status: DC | PRN

## 2017-06-16 NOTE — Patient Instructions (Addendum)
You need to schedule your mammogram.  Please call [facility] at [phone] to schedule.   IF you received an x-ray today, you will receive an invoice from [facility]. Please contact [facility] at [phone] with questions or concerns regarding your invoice.   IF you received labwork today, you will receive an invoice from [facility]. Please contact [facility] at [phone] with questions or concerns regarding your invoice.   Our billing staff will not be able to assist you with questions regarding bills from these companies.  You will be contacted with the lab results as soon as they are available. The fastest way to get your results is to activate your My Chart account. Instructions are located on the last page of this paperwork. If you have not heard from [place] regarding the results in 2 weeks, please contact this office.     Calcium Intake Recommendations Calcium is a mineral that affects many functions in the body, including:  Blood clotting.  Blood vessel function.  Nerve impulse conduction.  Hormone secretion.  Muscle contraction.  Bone and teeth functions.  Most of your body's calcium supply is stored in your bones and teeth. When your calcium stores are low, you may be at risk for low bone mass, bone loss, and bone fractures. Consuming enough calcium helps to grow healthy bones and teeth and to prevent breakdown over time. It is very important that you get enough calcium if you are:  A child undergoing rapid growth.  An adolescent girl.  A pre- or post-menopausal woman.  A woman whose menstrual cycle has stopped due to anorexia nervosa or regular intense exercise.  An individual with lactose intolerance or a milk allergy.  A vegetarian.  What is my plan? Try to consume the recommended amount of calcium daily based on your age. Depending on your overall health, your health care provider may recommend increased calcium  intake.General daily calcium intake recommendations by age are:  Birth to 6 months: 200 mg.  Infants 7 to 12 months: 260 mg.  Children 1 to 3 years: 700 mg.  Children 4 to 8 years: 1,000 mg.  Children 9 to 13 years: 1,300 mg.  Teens 14 to 18 years: 1,300 mg.  Adults 19 to 50 years: 1,000 mg.  Adult women 51 to 70 years: 1,200 mg.  Adult men 51 to 70 years: 1,000 mg.  Adults 71 years and older: 1,200 mg.  Pregnant and breastfeeding teens: 1,300 mg.  Pregnant and breastfeeding adults: 1,000 mg.  What do I need to know about calcium intake?  In order for the body to absorb calcium, it needs vitamin D. You can get vitamin D through: ? Direct exposure of the skin to sunlight. ? Foods, such as egg yolks, liver, saltwater fish, and fortified milk. ? Supplements.  Consuming too much calcium may cause: ? Constipation. ? Decreased absorption of iron and zinc. ? Kidney stones.  Calcium supplements may interact with certain medicines. Check with your health care provider before starting any calcium supplements.  Try to get most of your calcium from food. What foods can I eat? Grains  Fortified oatmeal. Fortified ready-to-eat cereals. Fortified frozen waffles. Vegetables Turnip greens. Broccoli. Fruits Fortified orange juice. Meats and Other Protein Sources Canned sardines with bones. Canned salmon with bones. Soy beans. Tofu. Baked beans. Almonds. [place] nuts. Sunflower seeds. Dairy Milk. Yogurt. Cheese. Cottage cheese. Beverages Fortified soy milk. Fortified rice milk. Sweets/Desserts Pudding. Ice Cream. Milkshakes. Blackstrap molasses. The items listed above  may not be a complete list of recommended foods or beverages. Contact your dietitian for more options. What foods can affect my calcium intake? It may be more difficult for your body to use calcium or calcium may leave your body more quickly if you consume large amounts  of:  Sodium.  Protein.  Caffeine.  Alcohol.  This information is not intended to replace advice given to you by your health care provider. Make sure you discuss any questions you have with your health care provider. Document Released: 03/03/2004 Document Revised: 02/07/2016 Document Reviewed: 12/26/2013 Elsevier Interactive Patient Education  2018 [place] protect organs, store calcium, and anchor muscles. Good health habits, such as eating nutritious foods and exercising regularly, are important for maintaining healthy bones. They can also help to prevent a condition that causes bones to lose density and become weak and brittle (osteoporosis). Why is bone mass important? Bone mass refers to the amount of bone tissue that you have. The higher your bone mass, the stronger your bones. An important step toward having healthy bones throughout life is to have strong and dense bones during childhood. A young adult who has a high bone mass is more likely to have a high bone mass later in life. Bone mass at its greatest it is called peak bone mass. A large decline in bone mass occurs in older adults. In women, it occurs about the time of menopause. During this time, it is important to practice good health habits, because if more bone is lost than what is replaced, the bones will become less healthy and more likely to break (fracture). If you find that you have a low bone mass, you may be able to prevent osteoporosis or further bone loss by changing your diet and lifestyle. How can I find out if my bone mass is low? Bone mass can be measured with an X-ray test that is called a bone mineral density (BMD) test. This test is recommended for all women who are age 63 or older. It may also be recommended for men who are age 52 or older, or for people who are more likely to develop osteoporosis due to:  Having bones that break easily.  Having a long-term disease that weakens bones,  such as kidney disease or rheumatoid arthritis.  Having menopause earlier than normal.  Taking medicine that weakens bones, such as steroids, thyroid hormones, or hormone treatment for breast cancer or prostate cancer.  Smoking.  Drinking three or more alcoholic drinks each day.  What are the nutritional recommendations for healthy bones? To have healthy bones, you need to get enough of the right minerals and vitamins. Most nutrition experts recommend getting these nutrients from the foods that you eat. Nutritional recommendations vary from person to person. Ask your health care provider what is healthy for you. Here are some general guidelines. Calcium Recommendations Calcium is the most important (essential) mineral for bone health. Most people can get enough calcium from their diet, but supplements may be recommended for people who are at risk for osteoporosis. Good sources of calcium include:  Dairy products, such as low-fat or nonfat milk, cheese, and yogurt.  Dark green leafy vegetables, such as bok choy and broccoli.  Calcium-fortified foods, such as orange juice, cereal, bread, soy beverages, and tofu products.  Nuts, such as almonds.  Follow these recommended amounts for daily calcium intake:  Children, age 63?3: 700 mg.  Children, age 87?8: 1,000 mg.  Children, age 34?13: 27,300  mg.  Teens, age 38?18: 1,300 mg.  Adults, age 82?50: 1,000 mg.  Adults, age 78?70: ? Men: 1,000 mg. ? Women: 1,200 mg.  Adults, age 102 or older: 1,200 mg.  Pregnant and breastfeeding females: ? Teens: 1,300 mg. ? Adults: 1,000 mg.  Vitamin D Recommendations Vitamin D is the most essential vitamin for bone health. It helps the body to absorb calcium. Sunlight stimulates the skin to make vitamin D, so be sure to get enough sunlight. If you live in a cold climate or you do not get outside often, your health care provider may recommend that you take vitamin D supplements. Good sources of  vitamin D in your diet include:  Egg yolks.  Saltwater fish.  Milk and cereal fortified with vitamin D.  Follow these recommended amounts for daily vitamin D intake:  Children and teens, age 42?18: 23 international units.  Adults, age 22 or younger: 400-800 international units.  Adults, age 25 or older: 800-1,000 international units.  Other Nutrients Other nutrients for bone health include:  Phosphorus. This mineral is found in meat, poultry, dairy foods, nuts, and legumes. The recommended daily intake for adult men and adult women is 700 mg.  Magnesium. This mineral is found in seeds, nuts, dark green vegetables, and legumes. The recommended daily intake for adult men is 400?420 mg. For adult women, it is 310?320 mg.  Vitamin K. This vitamin is found in green leafy vegetables. The recommended daily intake is 120 mg for adult men and 90 mg for adult women.  What type of physical activity is best for building and maintaining healthy bones? Weight-bearing and strength-building activities are important for building and maintaining peak bone mass. Weight-bearing activities cause muscles and bones to work against gravity. Strength-building activities increases muscle strength that supports bones. Weight-bearing and muscle-building activities include:  Walking and hiking.  Jogging and running.  Dancing.  Gym exercises.  Lifting weights.  Tennis and racquetball.  Climbing stairs.  Aerobics.  Adults should get at least 30 minutes of moderate physical activity on most days. Children should get at least 60 minutes of moderate physical activity on most days. Ask your health care provide what type of exercise is best for you. Where can I find more information? For more information, check out the following websites:  [place]: YardHomes.se  [employer] of Health:  http://www.niams.AnonymousEar.fr.asp  This information is not intended to replace advice given to you by your health care provider. Make sure you discuss any questions you have with your health care provider. Document Released: 10/10/2003 Document Revised: 02/07/2016 Document Reviewed: 07/25/2014 Elsevier Interactive Patient Education  2018 [place] refers to food and lifestyle choices that are based on the traditions of countries located on the [employer]. This way of eating has been shown to help prevent certain conditions and improve outcomes for people who have chronic diseases, like kidney disease and heart disease. What are tips for following this plan? Lifestyle  Cook and eat meals together with your family, when possible.  Drink enough fluid to keep your urine clear or pale yellow.  Be physically active every day. This includes: ? Aerobic exercise like running or swimming. ? Leisure activities like gardening, walking, or housework.  Get 7-8 hours of sleep each night.  If recommended by your health care provider, drink red wine in moderation. This means 1 glass a day for nonpregnant women and 2 glasses a day for men. A glass of  wine equals 5 oz (150 mL). Reading food labels  Check the serving size of packaged foods. For foods such as rice and pasta, the serving size refers to the amount of cooked product, not dry.  Check the total fat in packaged foods. Avoid foods that have saturated fat or trans fats.  Check the ingredients list for added sugars, such as corn syrup. Shopping  At the grocery store, buy most of your food from the areas near the walls of the store. This includes: ? Fresh fruits and vegetables (produce). ? Grains, beans, nuts, and seeds. Some of these may be available in unpackaged forms or large amounts (in bulk). ? Fresh seafood. ? Poultry and eggs. ? Low-fat  dairy products.  Buy whole ingredients instead of prepackaged foods.  Buy fresh fruits and vegetables in-season from local farmers markets.  Buy frozen fruits and vegetables in resealable bags.  If you do not have access to quality fresh seafood, buy precooked frozen shrimp or canned fish, such as tuna, salmon, or sardines.  Buy small amounts of raw or cooked vegetables, salads, or olives from the deli or salad bar at your store.  Stock your pantry so you always have certain foods on hand, such as olive oil, canned tuna, canned tomatoes, rice, pasta, and beans. Cooking  Cook foods with extra-virgin olive oil instead of using butter or other vegetable oils.  Have meat as a side dish, and have vegetables or grains as your main dish. This means having meat in small portions or adding small amounts of meat to foods like pasta or stew.  Use beans or vegetables instead of meat in common dishes like chili or lasagna.  Experiment with different cooking methods. Try roasting or broiling vegetables instead of steaming or sauteing them.  Add frozen vegetables to soups, stews, pasta, or rice.  Add nuts or seeds for added healthy fat at each meal. You can add these to yogurt, salads, or vegetable dishes.  Marinate fish or vegetables using olive oil, lemon juice, garlic, and fresh herbs. Meal planning  Plan to eat 1 vegetarian meal one day each week. Try to work up to 2 vegetarian meals, if possible.  Eat seafood 2 or more times a week.  Have healthy snacks readily available, such as: ? Vegetable sticks with hummus. ? [place] yogurt. ? Fruit and nut trail mix.  Eat balanced meals throughout the week. This includes: ? Fruit: 2-3 servings a day ? Vegetables: 4-5 servings a day ? Low-fat dairy: 2 servings a day ? Fish, poultry, or lean meat: 1 serving a day ? Beans and legumes: 2 or more servings a week ? Nuts and seeds: 1-2 servings a day ? Whole grains: 6-8 servings a  day ? Extra-virgin olive oil: 3-4 servings a day  Limit red meat and sweets to only a few servings a month What are my food choices?  Mediterranean diet ? Recommended ? Grains: Whole-grain pasta. Brown rice. Bulgar wheat. Polenta. Couscous. Whole-wheat bread. [place]. ? Vegetables: Artichokes. Beets. Broccoli. Cabbage. Carrots. Eggplant. Green beans. Chard. Kale. Spinach. Onions. Leeks. Peas. Squash. Tomatoes. Peppers. Radishes. ? Fruits: Apples. Apricots. Avocado. Berries. Bananas. Cherries. Dates. Figs. Grapes. Lemons. Melon. Oranges. Peaches. Plums. Pomegranate. ? Meats and other protein foods: Beans. Almonds. Sunflower seeds. Pine nuts. Peanuts. [place]. Salmon. Scallops. Shrimp. [place]. Tilapia. Clams. Oysters. Eggs. ? Dairy: Low-fat milk. Cheese. Greek yogurt. ? Beverages: Water. Red wine. Herbal tea. ? Fats and oils: Extra virgin olive oil. Avocado oil. Grape seed oil. ?  Sweets and desserts: [place] yogurt with honey. Baked apples. Poached pears. Trail mix. ? Seasoning and other foods: Basil. Cilantro. Coriander. Cumin. Mint. Parsley. Sage. Rosemary. Tarragon. Garlic. Oregano. Thyme. Pepper. Balsalmic vinegar. Tahini. Hummus. Tomato sauce. Olives. Mushrooms. ? Limit these ? Grains: Prepackaged pasta or rice dishes. Prepackaged cereal with added sugar. ? Vegetables: Deep fried potatoes (french fries). ? Fruits: Fruit canned in syrup. ? Meats and other protein foods: Beef. Pork. Lamb. Poultry with skin. Hot dogs. [place] Salines. ? Dairy: Ice cream. Sour cream. Whole milk. ? Beverages: Juice. Sugar-sweetened soft drinks. Beer. Liquor and spirits. ? Fats and oils: Butter. [place] oil. Vegetable oil. Beef fat (tallow). Lard. ? Sweets and desserts: Cookies. Cakes. Pies. Candy. ? Seasoning and other foods: Mayonnaise. Premade sauces and marinades. ? The items listed may not be a complete list. Talk with your dietitian about what dietary choices are right for you. Summary  The Mediterranean diet  includes both food and lifestyle choices.  Eat a variety of fresh fruits and vegetables, beans, nuts, seeds, and whole grains.  Limit the amount of red meat and sweets that you eat.  Talk with your health care provider about whether it is safe for you to drink red wine in moderation. This means 1 glass a day for nonpregnant women and 2 glasses a day for men. A glass of wine equals 5 oz (150 mL). This information is not intended to replace advice given to you by your health care provider. Make sure you discuss any questions you have with your health care provider. Document Released: 03/12/2016 Document Revised: 04/14/2016 Document Reviewed: 03/12/2016 Elsevier Interactive Patient Education  [employer].      Why follow it? Research shows. . Those who follow the Mediterranean diet have a reduced risk of heart disease  . The diet is associated with a reduced incidence of Parkinson's and Alzheimer's diseases . People following the diet may have longer life expectancies and lower rates of chronic diseases  . The Dietary Guidelines for Americans recommends the Mediterranean diet as an eating plan to promote health and prevent disease  What Is the Mediterranean Diet?  . Healthy eating plan based on typical foods and recipes of Mediterranean-style cooking . The diet is primarily a plant based diet; these foods should make up a majority of meals   Starches - Plant based foods should make up a majority of meals - They are an important sources of vitamins, minerals, energy, antioxidants, and fiber - Choose whole grains, foods high in fiber and minimally processed items  - Typical grain sources include wheat, oats, barley, corn, brown rice, bulgar, farro, millet, polenta, couscous  - Various types of beans include chickpeas, lentils, fava beans, black beans, white beans   Fruits  Veggies - Large quantities of antioxidant rich fruits & veggies; 6 or more servings  - Vegetables can be eaten raw  or lightly drizzled with oil and cooked  - Vegetables common to the traditional Mediterranean Diet include: artichokes, arugula, beets, broccoli, brussel sprouts, cabbage, carrots, celery, collard greens, cucumbers, eggplant, kale, leeks, lemons, lettuce, mushrooms, okra, onions, peas, peppers, potatoes, pumpkin, radishes, rutabaga, shallots, spinach, sweet potatoes, turnips, zucchini - Fruits common to the Mediterranean Diet include: apples, apricots, avocados, cherries, clementines, dates, figs, grapefruits, grapes, melons, nectarines, oranges, peaches, pears, pomegranates, strawberries, tangerines  Fats - Replace butter and margarine with healthy oils, such as olive oil, canola oil, and tahini  - Limit nuts to no more than a handful a day  -  Nuts include walnuts, almonds, pecans, pistachios, pine nuts  - Limit or avoid candied, honey roasted or heavily salted nuts - Olives are central to the Mediterranean diet - can be eaten whole or used in a variety of dishes   Meats Protein - Limiting red meat: no more than a few times a month - When eating red meat: choose lean cuts and keep the portion to the size of deck of cards - Eggs: approx. 0 to 4 times a week  - Fish and lean poultry: at least 2 a week  - Healthy protein sources include, chicken, [place], lean beef, lamb - Increase intake of seafood such as tuna, salmon, trout, mackerel, shrimp, scallops - Avoid or limit high fat processed meats such as sausage and bacon  Dairy - Include moderate amounts of low fat dairy products  - Focus on healthy dairy such as fat free yogurt, skim milk, low or reduced fat cheese - Limit dairy products higher in fat such as whole or 2% milk, cheese, ice cream  Alcohol - Moderate amounts of red wine is ok  - No more than 5 oz daily for women (all ages) and men older than age 62  - No more than 10 oz of wine daily for men younger than 21  Other - Limit sweets and other desserts  - Use herbs and spices instead of  salt to flavor foods  - Herbs and spices common to the traditional Mediterranean Diet include: basil, bay leaves, chives, cloves, cumin, fennel, garlic, lavender, marjoram, mint, oregano, parsley, pepper, rosemary, sage, savory, sumac, tarragon, thyme   It's not just a diet, it's a lifestyle:  . The Mediterranean diet includes lifestyle factors typical of those in the region  . Foods, drinks and meals are best eaten with others and savored . Daily physical activity is important for overall good health . This could be strenuous exercise like running and aerobics . This could also be more leisurely activities such as walking, housework, yard-work, or taking the stairs . Moderation is the key; a balanced and healthy diet accommodates most foods and drinks . Consider portion sizes and frequency of consumption of certain foods   Meal Ideas & Options:  . Breakfast:  o Whole wheat toast or whole wheat English muffins with peanut butter & hard boiled egg o Steel cut oats topped with apples & cinnamon and skim milk  o Fresh fruit: banana, strawberries, melon, berries, peaches  o Smoothies: strawberries, bananas, greek yogurt, peanut butter o Low fat greek yogurt with blueberries and granola  o Egg white omelet with spinach and mushrooms o Breakfast couscous: whole wheat couscous, apricots, skim milk, cranberries  . Sandwiches:  o Hummus and grilled vegetables (peppers, zucchini, squash) on whole wheat bread   o Grilled chicken on whole wheat pita with lettuce, tomatoes, cucumbers or tzatziki  o Tuna salad on whole wheat bread: tuna salad made with greek yogurt, olives, red peppers, capers, green onions o Garlic rosemary lamb pita: lamb sauted with garlic, rosemary, salt & pepper; add lettuce, cucumber, greek yogurt to pita - flavor with lemon juice and black pepper  . Seafood:  o Mediterranean grilled salmon, seasoned with garlic, basil, parsley, lemon juice and black pepper o Shrimp, lemon, and  spinach whole-grain pasta salad made with low fat greek yogurt  o Seared scallops with lemon orzo  o Seared tuna steaks seasoned salt, pepper, coriander topped with tomato mixture of olives, tomatoes, olive oil, minced garlic, parsley, green onions and  cappers  . Meats:  o Herbed greek chicken salad with kalamata olives, cucumber, feta  o Red bell peppers stuffed with spinach, bulgur, lean ground beef (or lentils) & topped with feta   o Kebabs: skewers of chicken, tomatoes, onions, zucchini, squash  o [place] burgers: made with red onions, mint, dill, lemon juice, feta cheese topped with roasted red peppers . Vegetarian o Cucumber salad: cucumbers, artichoke hearts, celery, red onion, feta cheese, tossed in olive oil & lemon juice  o Hummus and whole grain pita points with a greek salad (lettuce, tomato, feta, olives, cucumbers, red onion) o Lentil soup with celery, carrots made with vegetable broth, garlic, salt and pepper  o Tabouli salad: parsley, bulgur, mint, scallions, cucumbers, tomato, radishes, lemon juice, olive oil, salt and pepper.       Health Maintenance, Female Adopting a healthy lifestyle and getting preventive care can go a long way to promote health and wellness. Talk with your health care provider about what schedule of regular examinations is right for you. This is a good chance for you to check in with your provider about disease prevention and staying healthy. In between checkups, there are plenty of things you can do on your own. Experts have done a lot of research about which lifestyle changes and preventive measures are most likely to keep you healthy. Ask your health care provider for more information. Weight and diet Eat a healthy diet  Be sure to include plenty of vegetables, fruits, low-fat dairy products, and lean protein.  Do not eat a lot of foods high in solid fats, added sugars, or salt.  Get regular exercise. This is one of the most important things you can  do for your health. ? Most adults should exercise for at least 150 minutes each week. The exercise should increase your heart rate and make you sweat (moderate-intensity exercise). ? Most adults should also do strengthening exercises at least twice a week. This is in addition to the moderate-intensity exercise.  Maintain a healthy weight  Body mass index (BMI) is a measurement that can be used to identify possible weight problems. It estimates body fat based on height and weight. Your health care provider can help determine your BMI and help you achieve or maintain a healthy weight.  For females 61 years of age and older: ? A BMI below 18.5 is considered underweight. ? A BMI of 18.5 to 24.9 is normal. ? A BMI of 25 to 29.9 is considered overweight. ? A BMI of 30 and above is considered obese.  Watch levels of cholesterol and blood lipids  You should start having your blood tested for lipids and cholesterol at 75 years of age, then have this test every 5 years.  You may need to have your cholesterol levels checked more often if: ? Your lipid or cholesterol levels are high. ? You are older than 75 years of age. ? You are at high risk for heart disease.  Cancer screening Lung Cancer  Lung cancer screening is recommended for adults 53-49 years old who are at high risk for lung cancer because of a history of smoking.  A yearly low-dose CT scan of the lungs is recommended for people who: ? Currently smoke. ? Have quit within the past 15 years. ? Have at least a 30-pack-year history of smoking. A pack year is smoking an average of one pack of cigarettes a day for 1 year.  Yearly screening should continue until it has been 15 years  since you quit.  Yearly screening should stop if you develop a health problem that would prevent you from having lung cancer treatment.  Breast Cancer  Practice breast self-awareness. This means understanding how your breasts normally appear and feel.  It  also means doing regular breast self-exams. Let your health care provider know about any changes, no matter how small.  If you are in your 20s or 30s, you should have a clinical breast exam (CBE) by a health care provider every 1-3 years as part of a regular health exam.  If you are 52 or older, have a CBE every year. Also consider having a breast X-ray (mammogram) every year.  If you have a family history of breast cancer, talk to your health care provider about genetic screening.  If you are at high risk for breast cancer, talk to your health care provider about having an MRI and a mammogram every year.  Breast cancer gene (BRCA) assessment is recommended for women who have family members with BRCA-related cancers. BRCA-related cancers include: ? Breast. ? Ovarian. ? Tubal. ? Peritoneal cancers.  Results of the assessment will determine the need for genetic counseling and BRCA1 and BRCA2 testing.  Cervical Cancer Your health care provider may recommend that you be screened regularly for cancer of the pelvic organs (ovaries, uterus, and vagina). This screening involves a pelvic examination, including checking for microscopic changes to the surface of your cervix (Pap test). You may be encouraged to have this screening done every 3 years, beginning at age 28.  For women ages 19-65, health care providers may recommend pelvic exams and Pap testing every 3 years, or they may recommend the Pap and pelvic exam, combined with testing for human papilloma virus (HPV), every 5 years. Some types of HPV increase your risk of cervical cancer. Testing for HPV may also be done on women of any age with unclear Pap test results.  Other health care providers may not recommend any screening for nonpregnant women who are considered low risk for pelvic cancer and who do not have symptoms. Ask your health care provider if a screening pelvic exam is right for you.  If you have had past treatment for cervical  cancer or a condition that could lead to cancer, you need Pap tests and screening for cancer for at least 20 years after your treatment. If Pap tests have been discontinued, your risk factors (such as having a new sexual partner) need to be reassessed to determine if screening should resume. Some women have medical problems that increase the chance of getting cervical cancer. In these cases, your health care provider may recommend more frequent screening and Pap tests.  Colorectal Cancer  This type of cancer can be detected and often prevented.  Routine colorectal cancer screening usually begins at 75 years of age and continues through 75 years of age.  Your health care provider may recommend screening at an earlier age if you have risk factors for colon cancer.  Your health care provider may also recommend using home test kits to check for hidden blood in the stool.  A small camera at the end of a tube can be used to examine your colon directly (sigmoidoscopy or colonoscopy). This is done to check for the earliest forms of colorectal cancer.  Routine screening usually begins at age 45.  Direct examination of the colon should be repeated every 5-10 years through 75 years of age. However, you may need to be screened more often if  early forms of precancerous polyps or small growths are found.  Skin Cancer  Check your skin from head to toe regularly.  Tell your health care provider about any new moles or changes in moles, especially if there is a change in a mole's shape or color.  Also tell your health care provider if you have a mole that is larger than the size of a pencil eraser.  Always use sunscreen. Apply sunscreen liberally and repeatedly throughout the day.  Protect yourself by wearing long sleeves, pants, a wide-brimmed hat, and sunglasses whenever you are outside.  Heart disease, diabetes, and high blood pressure  High blood pressure causes heart disease and increases the risk  of stroke. High blood pressure is more likely to develop in: ? People who have blood pressure in the high end of the normal range (130-139/85-89 mm Hg). ? People who are overweight or obese. ? People who are African American.  If you are 61-43 years of age, have your blood pressure checked every 3-5 years. If you are 57 years of age or older, have your blood pressure checked every year. You should have your blood pressure measured twice-once when you are at a hospital or clinic, and once when you are not at a hospital or clinic. Record the average of the two measurements. To check your blood pressure when you are not at a hospital or clinic, you can use: ? An automated blood pressure machine at a pharmacy. ? A home blood pressure monitor.  If you are between 45 years and 10 years old, ask your health care provider if you should take aspirin to prevent strokes.  Have regular diabetes screenings. This involves taking a blood sample to check your fasting blood sugar level. ? If you are at a normal weight and have a low risk for diabetes, have this test once every three years after 75 years of age. ? If you are overweight and have a high risk for diabetes, consider being tested at a younger age or more often. Preventing infection Hepatitis B  If you have a higher risk for hepatitis B, you should be screened for this virus. You are considered at high risk for hepatitis B if: ? You were born in a country where hepatitis B is common. Ask your health care provider which countries are considered high risk. ? Your parents were born in a high-risk country, and you have not been immunized against hepatitis B (hepatitis B vaccine). ? You have HIV or AIDS. ? You use needles to inject street drugs. ? You live with someone who has hepatitis B. ? You have had sex with someone who has hepatitis B. ? You get hemodialysis treatment. ? You take certain medicines for conditions, including cancer, organ  transplantation, and autoimmune conditions.  Hepatitis C  Blood testing is recommended for: ? Everyone born from [birth year] through 1964. ? Anyone with known risk factors for hepatitis C.  Sexually transmitted infections (STIs)  You should be screened for sexually transmitted infections (STIs) including gonorrhea and chlamydia if: ? You are sexually active and are younger than 75 years of age. ? You are older than 75 years of age and your health care provider tells you that you are at risk for this type of infection. ? Your sexual activity has changed since you were last screened and you are at an increased risk for chlamydia or gonorrhea. Ask your health care provider if you are at risk.  If you do not have  HIV, but are at risk, it may be recommended that you take a prescription medicine daily to prevent HIV infection. This is called pre-exposure prophylaxis (PrEP). You are considered at risk if: ? You are sexually active and do not regularly use condoms or know the HIV status of your partner(s). ? You take drugs by injection. ? You are sexually active with a partner who has HIV.  Talk with your health care provider about whether you are at high risk of being infected with HIV. If you choose to begin PrEP, you should first be tested for HIV. You should then be tested every 3 months for as long as you are taking PrEP. Pregnancy  If you are premenopausal and you may become pregnant, ask your health care provider about preconception counseling.  If you may become pregnant, take 400 to 800 micrograms (mcg) of folic acid every day.  If you want to prevent pregnancy, talk to your health care provider about birth control (contraception). Osteoporosis and menopause  Osteoporosis is a disease in which the bones lose minerals and strength with aging. This can result in serious bone fractures. Your risk for osteoporosis can be identified using a bone density scan.  If you are 34 years of age or  older, or if you are at risk for osteoporosis and fractures, ask your health care provider if you should be screened.  Ask your health care provider whether you should take a calcium or vitamin D supplement to lower your risk for osteoporosis.  Menopause may have certain physical symptoms and risks.  Hormone replacement therapy may reduce some of these symptoms and risks. Talk to your health care provider about whether hormone replacement therapy is right for you. Follow these instructions at home:  Schedule regular health, dental, and eye exams.  Stay current with your immunizations.  Do not use any tobacco products including cigarettes, chewing tobacco, or electronic cigarettes.  If you are pregnant, do not drink alcohol.  If you are breastfeeding, limit how much and how often you drink alcohol.  Limit alcohol intake to no more than 1 drink per day for nonpregnant women. One drink equals 12 ounces of beer, 5 ounces of wine, or 1 ounces of hard liquor.  Do not use street drugs.  Do not share needles.  Ask your health care provider for help if you need support or information about quitting drugs.  Tell your health care provider if you often feel depressed.  Tell your health care provider if you have ever been abused or do not feel safe at home. This information is not intended to replace advice given to you by your health care provider. Make sure you discuss any questions you have with your health care provider. Document Released: 02/02/2011 Document Revised: 12/26/2015 Document Reviewed: 04/23/2015 Elsevier Interactive Patient Education  [employer].

## 2017-06-16 NOTE — Progress Notes (Signed)
Subjective:  By signing my name below, I, Moises Blood, attest that this documentation has been prepared under the direction and in the presence of [name], MD. Electronically Signed: Moises Blood, [place]. 06/16/2017 , 10:21 AM .  Patient was seen in Room 1 .   Patient ID: Cynthia Castro, female    DOB: 02-27-1941, 76 y.o.   MRN: [id number] Chief Complaint  Patient presents with  . Annual Exam   HPI  Cynthia Castro is a delightful 76 yo woman who is here today for her complete physical. I last saw her 1 yr prev for the same. She is fasting today.   Primary Preventative Screenings: Cervical Cancer: GYN care by Dr. Quincy Simmonds - seen 02/12/17. 01/24/16 Pap smear negative w/ no h/o abnml.  STI screening: not sexually active, sees gyn.  Breast Cancer: diagnostic mam and [place] 07/31/15 -> fibroglandular tissue, due for screening mam and [facility].  Colorectal Cancer: Colonoscopy 01/2012 w/ GI Dr. Michail Sermon -> 10 yr recall but may age out Tobacco use/EtOH/substances: No smoking hx. Rare soc EtOH. Bone Density: do not have results. nml per gyn notes. Pt declines to repeat as reports all prior studies have been normal - done in5/09, 3/08, 6/04. Pt is doingweight bearing exercise and drinking milk. Drinks milk regularly - 1/2 gallon/wk, no exogenous Ca/Vit D supp. Vit D nml @ 43 05/2013. Still drinking milk regularly.  Cardiac: EKG 01/08/2016 Weight/Blood sugar/Diet/Exercise: overweight - would like to loose, exercising with water aerobics at [facility] and walking. Yoga, rides bike.  Generally healthy diet. a1c nml 5.4 10/2014 w/ all gluc <100 since; she's still going to the [facility] water aerobics and yoga about 2-3 times a week.     BMI Readings from Last 3 Encounters:  02/12/17 28.84 kg/m  05/14/16 28.07 kg/m  01/24/16 30.02 kg/m   RecentLabs       Lab Results  Component Value Date   HGBA1C 5.4 10/26/2014     OTC/Vit/Supp/Herbal: occasional melatonin and rare  benadryl to help her sleep; she denies taking any vitamins.  Dentist/Optho: ophtho Dr. Katy Fitch, Dr. Bevelyn Buckles dentist, both seen reg. Immunizations: Zostavax done 2015     Immunization History  Administered Date(s) Administered  . Influenza,inj,Quad PF,6+ Mos 05/05/2013, 05/08/2014, 05/09/2015, 05/14/2016  . Pneumococcal Conjugate-13 05/09/2015  . Pneumococcal Polysaccharide-23 04/22/2007  . Td 03/02/2002  . Tdap 12/22/2012   Derm: Dr. Denna Haggard  Chronic Medical Conditions: GERD - presents as LUQ tightness 2-3 hrs after eating, improves with low acid diet, good posture, and drinking enough water. Resolves with prn peptobismol and scheduled ranitidine. Has had + FOBT several times in 2015 - saw GI Dr. Michail Sermon at that time.  s/p cholecystectomy in 2005. She notes her heartburn has pretty much resolved.   Arthralgias - prn diclofenac. She requested stronger dose/medication because she feels like a few hours pass and she feels her pain again, thinking "oh wow, it's already over?" She usually takes a dose in the morning and sometimes in the evenings. She stopped taking turmeric supplement because she feels like there was no improvement. She also hasn't been taking her fish oil because she wasn't sure what it does.   Her neck hurts occasionally, worse at night when laying down. When this occurs, her fingers usually go numb bilaterally. When she changes the position of her arms, they usually improve. She denies any weakness or pain in her hands.   HLD -  When she had stopped trying to  work on diet, LDL increased from 118 to 169. Tried to cut out butter and fatty means and LDL decreased to 149 (last yr).  Last yr ASCVD risk 10.8% due to age as OPTIMAL risk is 10.7%. T chol 223->190 -> 249 ->223 trig      220->118->154 -> 143 hdl      50->  48 ->  49 ->   45 ldl       129 -> 118 ->169 ->149.    Overweight: trying to loose  Divorced, lives alone.    Past Medical History:  Diagnosis Date  .  Arthritis   . DDD (degenerative disc disease), cervical   . History of kidney stones 05/2013  . Hyperlipidemia   . Reflux    Prior to Admission medications   Medication Sig Start Date End Date Taking? Authorizing Provider  diclofenac (VOLTAREN) 75 MG EC tablet TAKE 1 TABLET(75 MG) BY MOUTH TWICE DAILY AS NEEDED FOR MODERATE PAIN 11/07/16  Yes Shawnee Knapp, MD  b complex vitamins tablet Take 1 tablet by mouth daily.    [provider]  IBUPROFEN PO Take by mouth as needed.    [provider]  MELATONIN PO Take by mouth as needed.    [provider]  Omega-3 Fatty Acids (FISH OIL PO) Take by mouth daily.    [provider]  TURMERIC PO Take by mouth. CAPSULE    [provider]   No Known Allergies   Past Surgical History:  Procedure Laterality Date  . CESAREAN SECTION  1984  . CHOLECYSTECTOMY  2005  . CYSTECTOMY  2003  . hysteroscopic resection     hysteroscopic resection of polyp  . LITHOTRIPSY  04/2013   --Dr. Janice Norrie  . UTERINE CYST REMOVAL  2005   Family History  Problem Relation Age of Onset  . Diabetes Mother   . Heart disease Mother        CHF  . Vision loss Mother   . Cancer Father        TESTICULAR  . Stroke Father 62  . Hyperlipidemia Father   . Hyperlipidemia Maternal Grandmother   . Diabetes Maternal Grandmother   . Heart attack Maternal Grandfather   . Cancer Paternal Grandmother   . Cancer Paternal Grandfather    Social History   Tobacco Use  . Smoking status: Never Smoker  . Smokeless tobacco: Never Used  Substance Use Topics  . Alcohol use: Yes    Comment: WINE OR BEER - 1 OR 2/month  . Drug use: No   Depression screen [facility] 2/9 06/16/2017 05/14/2016 01/08/2016 07/08/2015 05/09/2015  Decreased Interest 0 0 0 0 0  Down, Depressed, Hopeless 0 0 0 0 0  PHQ - 2 Score 0 0 0 0 0    Review of Systems  Constitutional: Negative for fatigue and unexpected weight change.  Respiratory: Negative for chest tightness and  shortness of breath.   Cardiovascular: Negative for chest pain, palpitations and leg swelling.  Gastrointestinal: Negative for abdominal pain and blood in stool.  Musculoskeletal: Positive for arthralgias, back pain and neck pain.  Neurological: Negative for dizziness, syncope, light-headedness and headaches.       Objective:   Physical Exam  Constitutional: She is oriented to person, place, and time. She appears well-developed and well-nourished. No distress.  HENT:  Head: Normocephalic and atraumatic.  Eyes: EOM are normal. Pupils are equal, round, and reactive to light.  Neck: Neck supple.  Cardiovascular: Normal rate.  Pulses:  Dorsalis pedis pulses are 2+ on the right side, and 2+ on the left side.  Pulmonary/Chest: Effort normal. No respiratory distress.  Abdominal: Soft. Bowel sounds are normal. She exhibits no distension and no mass. There is no tenderness. There is no rebound and no CVA tenderness.  Musculoskeletal: Normal range of motion. She exhibits no edema (lower).  Neurological: She is alert and oriented to person, place, and time.  Skin: Skin is warm and dry.  Psychiatric: She has a normal mood and affect. Her behavior is normal.  Nursing note and vitals reviewed.   BP 110/76   Pulse (!) 57   Temp 98.3 F (36.8 C)   Resp 16   Ht 5' 5.5" (1.664 m)   Wt 172 lb 9.6 oz (78.3 kg)   LMP 08/03/1998   SpO2 96%   BMI 28.29 kg/m      Assessment & Plan:  Sched AWV w/ [facility] (already has active order) Ua, cmp, lipid, cbc (tsh nml 01/2016) Flu shot, Shingrix  1. Annual physical exam   2. Encounter for screening mammogram for breast cancer   3. Need for prophylactic vaccination and inoculation against influenza   4. Gastroesophageal reflux disease, esophagitis presence not specified   5. Arthralgia of multiple joints   6. Hypercholesteremia   7. Overweight (BMI 25.0-29.9)   8. Medication monitoring encounter     Orders Placed This Encounter    Procedures  . Comprehensive metabolic panel    Order Specific Question:   Has the patient fasted?    Answer:   Yes  . CBC with Differential/Platelet  . Lipid panel    Order Specific Question:   Has the patient fasted?    Answer:   Yes    Meds ordered this encounter  Medications  . diphenhydrAMINE (BENADRYL) 25 MG tablet    Sig: Take 25 mg at bedtime as needed by mouth for sleep.  Marland Kitchen DISCONTD: indomethacin (INDOCIN) 50 MG capsule    Sig: Take 1 capsule (50 mg total) 3 (three) times daily as needed by mouth.    Dispense:  60 capsule    Refill:  5  . indomethacin (INDOCIN) 50 MG capsule    Sig: Take 1 capsule (50 mg total) 3 (three) times daily as needed by mouth.    Dispense:  60 capsule    Refill:  5    I personally performed the services described in this documentation, which was scribed in my presence. The recorded information has been reviewed and considered, and addended by me as needed.   [name], M.D.  Primary Care at [facility] [address] [place], [place] [address] [phone] phone [phone] fax  06/19/17 6:16 AM

## 2017-06-28 ENCOUNTER — Other Ambulatory Visit: Payer: Self-pay | Admitting: Family Medicine

## 2017-07-01 ENCOUNTER — Encounter: Payer: Self-pay | Admitting: Family Medicine

## 2017-10-25 ENCOUNTER — Other Ambulatory Visit: Payer: Self-pay | Admitting: Family Medicine

## 2017-10-25 ENCOUNTER — Encounter: Payer: Self-pay | Admitting: Family Medicine

## 2017-10-27 MED ORDER — DICLOFENAC SODIUM 75 MG PO TBEC: 75.0000 mg | DELAYED_RELEASE_TABLET | Freq: Two times a day (BID) | ORAL | 2 refills | Status: DC | PRN

## 2018-01-13 ENCOUNTER — Other Ambulatory Visit: Payer: Self-pay | Admitting: Family Medicine

## 2018-01-13 NOTE — Telephone Encounter (Signed)
diclofenac refill Last Refill:10/27/17 # 60 2 RF Last OV: 10/25/17 via email PCP: [name] MD Pharmacy:[facility] [address]

## 2018-01-25 ENCOUNTER — Other Ambulatory Visit: Payer: Self-pay | Admitting: Family Medicine

## 2018-01-25 DIAGNOSIS — Z1231 Encounter for screening mammogram for malignant neoplasm of breast: Secondary | ICD-10-CM

## 2018-01-27 ENCOUNTER — Ambulatory Visit
Admission: RE | Admit: 2018-01-27 | Discharge: 2018-01-27 | Disposition: A | Discharge status: Home / Self Care | Payer: Medicare Other | Source: Ambulatory Visit | Attending: Family Medicine | Admitting: Family Medicine

## 2018-01-27 DIAGNOSIS — Z1231 Encounter for screening mammogram for malignant neoplasm of breast: Secondary | ICD-10-CM

## 2018-02-04 ENCOUNTER — Other Ambulatory Visit: Payer: Self-pay

## 2018-02-04 ENCOUNTER — Ambulatory Visit (INDEPENDENT_AMBULATORY_CARE_PROVIDER_SITE_OTHER): Payer: Medicare Other | Admitting: Family Medicine

## 2018-02-04 ENCOUNTER — Encounter: Payer: Self-pay | Admitting: Family Medicine

## 2018-02-04 VITALS — BP 111/69 | HR 73 | Temp 99.0°F | Resp 16 | Ht 65.0 in | Wt 173.0 lb

## 2018-02-04 DIAGNOSIS — B370 Candidal stomatitis: Secondary | ICD-10-CM

## 2018-02-04 DIAGNOSIS — W57XXXA Bitten or stung by nonvenomous insect and other nonvenomous arthropods, initial encounter: Secondary | ICD-10-CM

## 2018-02-04 DIAGNOSIS — S30860A Insect bite (nonvenomous) of lower back and pelvis, initial encounter: Secondary | ICD-10-CM

## 2018-02-04 DIAGNOSIS — M791 Myalgia, unspecified site: Secondary | ICD-10-CM | POA: Diagnosis not present

## 2018-02-04 DIAGNOSIS — R509 Fever, unspecified: Secondary | ICD-10-CM | POA: Diagnosis not present

## 2018-02-04 DIAGNOSIS — R5382 Chronic fatigue, unspecified: Secondary | ICD-10-CM | POA: Diagnosis not present

## 2018-02-04 DIAGNOSIS — R6889 Other general symptoms and signs: Secondary | ICD-10-CM | POA: Diagnosis not present

## 2018-02-04 LAB — POCT CBC
GRANULOCYTE PERCENT: 56.4 % (ref 37–80)
HCT, POC: 47.1 % (ref 37.7–47.9)
HEMOGLOBIN: 15 g/dL (ref 12.2–16.2)
Lymph, poc: 2.5 (ref 0.6–3.4)
MCH: 28.3 pg (ref 27–31.2)
MCHC: 31.9 g/dL (ref 31.8–35.4)
MCV: 88.7 fL (ref 80–97)
MID (cbc): 0.3 (ref 0–0.9)
MPV: 7.5 fL (ref 0–99.8)
PLATELET COUNT, POC: 242 10*3/uL (ref 142–424)
POC Granulocyte: 3.6 (ref 2–6.9)
POC LYMPH PERCENT: 39.4 %L (ref 10–50)
POC MID %: 4.2 %M (ref 0–12)
RBC: 5.31 M/uL (ref 4.04–5.48)
RDW, POC: 13.5 %
WBC: 6.4 10*3/uL (ref 4.6–10.2)

## 2018-02-04 LAB — POC MICROSCOPIC URINALYSIS (UMFC)

## 2018-02-04 LAB — POCT URINALYSIS DIP (MANUAL ENTRY)
Bilirubin, UA: NEGATIVE
Glucose, UA: NEGATIVE mg/dL
Nitrite, UA: NEGATIVE
PROTEIN UA: NEGATIVE mg/dL
UROBILINOGEN UA: 0.2 U/dL
pH, UA: 5 (ref 5.0–8.0)

## 2018-02-04 LAB — GLUCOSE, POCT (MANUAL RESULT ENTRY): POC GLUCOSE: 86 mg/dL (ref 70–99)

## 2018-02-04 LAB — POCT RAPID STREP A (OFFICE): RAPID STREP A SCREEN: NEGATIVE

## 2018-02-04 LAB — POCT SEDIMENTATION RATE: POCT SED RATE: 23 mm/h — AB (ref 0–22)

## 2018-02-04 LAB — POCT SKIN KOH: Skin KOH, POC: POSITIVE — AB

## 2018-02-04 MED ORDER — CLOTRIMAZOLE 10 MG MT TROC: 10.0000 mg | Freq: Every day | OROMUCOSAL | 0 refills | Status: DC

## 2018-02-04 NOTE — Patient Instructions (Addendum)
IF you received an x-ray today, you will receive an invoice from [facility]. Please contact [facility] at [phone] with questions or concerns regarding your invoice.   IF you received labwork today, you will receive an invoice from [facility]. Please contact [facility] at [phone] with questions or concerns regarding your invoice.   Our billing staff will not be able to assist you with questions regarding bills from these companies.  You will be contacted with the lab results as soon as they are available. The fastest way to get your results is to activate your My Chart account. Instructions are located on the last page of this paperwork. If you have not heard from [place] regarding the results in 2 weeks, please contact this office.    Oral Thrush, Adult Oral thrush, also called oral candidiasis, is a fungal infection that develops in the mouth and throat and on the tongue. It causes white patches to form on the mouth and tongue. Ritta Slot is most common in older adults, but it can occur at any age. Many cases of thrush are mild, but this infection can also be serious. Ritta Slot can be a repeated (recurrent) problem for certain people who have a weak body defense system (immune system). The weakness can be caused by chronic illnesses, or by taking medicines that limit the body's ability to fight infection. If a person has difficulty fighting infection, the fungus that causes thrush can spread through the body. This can cause life-threatening blood or organ infections. What are the causes? This condition is caused by a fungus (yeast) called Candida albicans.  This fungus is normally present in small amounts in the mouth and on other mucous membranes. It usually causes no harm.  If conditions are present that allow the fungus to grow without control, it invades surrounding tissues and becomes an infection.  Other Candida species can also lead to thrush (rare).  What increases  the risk? This condition is more likely to develop in:  People with a weakened immune system.  Older adults.  People with HIV (human immunodeficiency virus).  People with diabetes.  People with dry mouth (xerostomia).  Pregnant women.  People with poor dental care, especially people who have false teeth.  People who use antibiotic medicines.  What are the signs or symptoms? Symptoms of this condition can vary from mild and moderate to severe and persistent. Symptoms may include:  A burning feeling in the mouth and throat. This can occur at the start of a thrush infection.  White patches that stick to the mouth and tongue. The tissue around the patches may be red, raw, and painful. If rubbed (during tooth brushing, for example), the patches and the tissue of the mouth may bleed easily.  A bad taste in the mouth or difficulty tasting foods.  A cottony feeling in the mouth.  Pain during eating and swallowing.  Poor appetite.  Cracking at the corners of the mouth.  How is this diagnosed? This condition is diagnosed based on:  Physical exam. Your health care provider will look in your mouth.  Health history. Your health care provider will ask you questions about your health.  How is this treated? This condition is treated with medicines called antifungals, which prevent the growth of fungi. These medicines are either applied directly to the affected area (topical) or swallowed (oral). The treatment will depend on the severity of the condition. Mild thrush Mild cases of thrush may clear up with the use of  an antifungal mouth rinse or lozenges. Treatment usually lasts about 14 days. Moderate to severe thrush  More severe thrush infections that have spread to the esophagus are treated with an oral antifungal medicine. A topical antifungal medicine may also be used.  For some severe infections, treatment may need to continue for more than 14 days.  Oral antifungal  medicines are rarely used during pregnancy because they may be harmful to the unborn child. If you are pregnant, talk with your health care provider about options for treatment. Persistent or recurrent thrush For cases of thrush that do not go away or keep coming back:  Treatment may be needed twice as long as the symptoms last.  Treatment will include both oral and topical antifungal medicines.  People with a weakened immune system can take an antifungal medicine on a continuous basis to prevent thrush infections.  It is important to treat conditions that make a person more likely to get thrush, such as diabetes or HIV. Follow these instructions at home: Medicines  Take over-the-counter and prescription medicines only as told by your health care provider.  Talk with your health care provider about an over-the-counter medicine called gentian violet, which kills bacteria and fungi. Relieving soreness and discomfort To help reduce the discomfort of thrush:  Drink cold liquids such as water or iced tea.  Try flavored ice treats or frozen juices.  Eat foods that are easy to swallow, such as gelatin, ice cream, or custard.  Try drinking from a straw if the patches in your mouth are painful.  General instructions  Eat plain, unflavored yogurt as directed by your health care provider. Check the label to make sure the yogurt contains live cultures. This yogurt can help healthy bacteria to grow in the mouth and can stop the growth of the fungus that causes thrush.  If you wear dentures, remove the dentures before going to bed, brush them vigorously, and soak them in a cleaning solution as directed by your health care provider.  Rinse your mouth with a warm salt-water mixture several times a day. To make a salt-water mixture, completely dissolve 1/2-1 tsp of salt in 1 cup of warm water. Contact a health care provider if:  Your symptoms are getting worse or are not improving within 7 days  of starting treatment.  You have symptoms of a spreading infection, such as white patches on the skin outside of the mouth. This information is not intended to replace advice given to you by your health care provider. Make sure you discuss any questions you have with your health care provider. Document Released: 04/14/2004 Document Revised: 04/13/2016 Document Reviewed: 04/13/2016 Elsevier Interactive Patient Education  2017 [place], Adult Ticks are insects that draw blood for food. Most ticks live in shrubs and grassy areas. They climb onto people and animals that brush against the leaves and grasses that they rest on. Then they bite, attaching themselves to the skin. Most ticks are harmless, but some ticks carry germs that can spread to a person through a bite and cause a disease. To reduce your risk of getting a disease from a tick bite, it is important to take steps to prevent tick bites. It is also important to check for ticks after being outdoors. If you find that a tick has attached to you, watch for symptoms of disease. How can  I prevent tick bites? Take these steps to help prevent tick bites when you are outdoors in an area where ticks are found:  Use insect repellent that has DEET (20% or higher), picaridin, or IR3535 in it. Use it on: ? Skin that is showing. ? The top of your boots. ? Your pant legs. ? Your sleeve cuffs.  For repellent products that contain permethrin, follow product instructions. Use these products on: ? Clothing. ? Gear. ? Boots. ? Tents.  Wear protective clothing. Long sleeves and long pants offer the best protection from ticks.  Wear light-colored clothing so you can see ticks more easily.  Tuck your pant legs into your socks.  If you go walking on a trail, stay in the middle of the trail so your skin, hair, and clothing do not touch the bushes.  Avoid walking through areas with long  grass.  Check for ticks on your clothing, hair, and skin often while you are outside, and check again before you go inside. Make sure to check the places that ticks attach themselves most often. These places include the scalp, neck, armpits, waist, groin, and joint areas. Ticks that carry a disease called Lyme disease have to be attached to the skin for 24-48 hours. Checking for ticks every day will lessen your risk of this and other diseases.  When you come indoors, wash your clothes and take a shower or a bath right away. Dry your clothes in a dryer on high heat for at least 60 minutes. This will kill any ticks in your clothes.  What is the proper way to remove a tick? If you find a tick on your body, remove it as soon as possible. Removing a tick sooner rather than later can prevent germs from passing from the tick to your body. To remove a tick that is crawling on your skin but has not bitten:  Go outdoors and brush the tick off.  Remove the tick with tape or a lint roller.  To remove a tick that is attached to your skin:  Wash your hands.  If you have latex gloves, put them on.  Use tweezers, curved forceps, or a tick-removal tool to gently grasp the tick as close to your skin and the tick's head as possible.  Gently pull with steady, upward pressure until the tick lets go. When removing the tick: ? Take care to keep the tick's head attached to its body. ? Do not twist or jerk the tick. This can make the tick's head or mouth break off. ? Do not squeeze or crush the tick's body. This could force disease-carrying fluids from the tick into your body.  Do not try to remove a tick with heat, alcohol, petroleum jelly, or fingernail polish. Using these methods can cause the tick to salivate and regurgitate into your bloodstream, increasing your risk of getting a disease. What should I do after removing a tick?  Clean the bite area with soap and water, rubbing alcohol, or an iodine  scrub.  If an antiseptic cream or ointment is available, apply a small amount to the bite site.  Wash and disinfect any instruments that you used to remove the tick. How should I dispose of a tick? To dispose of a live tick, use one of these methods:  Place it in rubbing alcohol.  Place it in a sealed bag or container.  Wrap it tightly in tape.  Flush it down the toilet.  Contact a health care provider if:  You have symptoms of a disease after a tick bite. Symptoms of a tick-borne disease can occur from moments after the tick bites to up to 30 days after a tick is removed. Symptoms include: ? Muscle, joint, or bone pain. ? Difficulty walking or moving your legs. ? Numbness in the legs. ? Paralysis. ? Red rash around the tick bite area that is shaped like a target or a "bull's-eye." ? Redness and swelling in the area of the tick bite. ? Fever. ? Repeated vomiting. ? Diarrhea. ? Weight loss. ? Tender, swollen lymph glands. ? Shortness of breath. ? Cough. ? Pain in the abdomen. ? Headache. ? Abnormal tiredness. ? A change in your level of consciousness. ? Confusion. Get help right away if:  You are not able to remove a tick.  A part of a tick breaks off and gets stuck in your skin.  Your symptoms get worse. Summary  Ticks may carry germs that can spread to a person through a bite and cause disease.  Wear protective clothing and use insect repellent to prevent tick bites. Follow product instructions.  If you find a tick on your body, remove it as soon as possible. If the tick is attached, do not try to remove with heat, alcohol, petroleum jelly, or fingernail polish.  Remove the attached tick using tweezers, curved forceps, or a tick-removal tool. Gently pull with steady, upward pressure until the tick lets go. Do not twist or jerk the tick. Do not squeeze or crush the tick's body.  If you have symptoms after being bitten by a tick, contact a health care  provider. This information is not intended to replace advice given to you by your health care provider. Make sure you discuss any questions you have with your health care provider. Document Released: 07/17/2000 Document Revised: 05/01/2016 Document Reviewed: 05/01/2016 Elsevier Interactive Patient Education  2018 [employer].  Lyme Disease Lyme disease is an infection that affects many parts of the body, including the skin, joints, and nervous system. It is a bacterial infection that starts from the bite of an infected tick. The infection can spread, and some of the symptoms are similar to the flu. If Lyme disease is not treated, it may cause joint pain, swelling, numbness, problems thinking, fatigue, muscle weakness, and other problems. What are the causes? This condition is caused by bacteria called Borrelia burgdorferi. You can get Lyme disease by being bitten by an infected tick. The tick must be attached to your skin to pass along the infection. Deer often carry infected ticks. What increases the risk? The following factors may make you more likely to develop this condition:  Living in or visiting these areas in the [place] ? [place]. ? [place]. ? [place].  Spending time in wooded or grassy areas.  Being outdoors with exposed skin.  Camping, gardening, hiking, fishing, or hunting outdoors.  Failing to remove a tick from your skin within 3-4 days.  What are the signs or symptoms? Symptoms of this condition include:  A round, red rash that surrounds the center of the tick bite. This is the first sign of infection. The center of the rash may be blood colored or have tiny blisters.  Fatigue.  Headache.  Chills and fever.  General achiness.  Joint pain, often in the knees.  Muscle pain.  Swollen lymph glands.  Stiff neck.  How is this diagnosed? This condition is diagnosed based on:  Your symptoms and medical history.  A physical  exam.  A blood test.  How is this treated? The main treatment for this condition is antibiotic medicine, which is usually taken by mouth (orally). The length of treatment depends on how soon after a tick bite you begin taking the medicine. In some cases, treatment is necessary for several weeks. If the infection is severe, antibiotics may need to be given through an IV tube that is inserted into one of your veins. Follow these instructions at home:  Take your antibiotic medicine as told by your health care provider. Do not stop taking the antibiotic even if you start to feel better.  Ask your health care provider about takinga probiotic in between doses of your antibiotic to help avoid stomach upset or diarrhea.  Check with your health care provider before supplementing your treatment. Many alternative therapies have not been proven and may be harmful to you.  Keep all follow-up visits as told by your health care provider. This is important. How is this prevented? You can become reinfected if you get another tick bite from an infected tick. Take these steps to help prevent an infection:  Cover your skin with light-colored clothing when you are outdoors in the spring and summer months.  Spray clothing and skin with bug spray. The spray should be 20-30% DEET.  Avoid wooded, grassy, and shaded areas.  Remove yard litter, brush, trash, and plants that attract deer and rodents.  Check yourself for ticks when you come indoors.  Wash clothing worn each day.  Check your pets for ticks before they come inside.  If you find a tick: ? Remove it with tweezers. ? Clean your hands and the bite area with rubbing alcohol or soap and water.  Pregnant women should take special care to avoid tick bites because the infection can be passed along to the fetus. Contact a health care provider if:  You have symptoms after treatment.  You have removed a tick and want to bring it to your health care  provider for testing. Get help right away if:  You have an irregular heartbeat.  You have nerve pain.  Your face feels numb. This information is not intended to replace advice given to you by your health care provider. Make sure you discuss any questions you have with your health care provider. Document Released: 10/26/2000 Document Revised: 03/10/2016 Document Reviewed: 03/10/2016 Elsevier Interactive Patient Education  2018 [employer].

## 2018-02-04 NOTE — Progress Notes (Signed)
Subjective:  By signing my name below, I, Cynthia Castro, attest that this documentation has been prepared under the direction and in the presence of [name], MD Electronically Signed: [name], ED Scribe 02/04/2018 at 9:03 AM.   Patient ID: Cynthia Castro, female    DOB: 1941-05-07, 77 y.o.   MRN: [id number]  Chief Complaint  Patient presents with  . Fever    x 1wk/ pt states highest was 100  . Generalized Body Aches    x 1 wk   HPI Cynthia Castro is a 77 y.o. female who presents to Primary Care at [facility] complaining of fever with Tmax of 100 F and generalized body aches x 1 wk, specifically in her extremities and facial pain. Pt has noticed decreased appetite as well. Denies sinus pain/pressure, rhinorrhea, changes in urine/bowels.  Past Medical History:  Diagnosis Date  . Arthritis   . DDD (degenerative disc disease), cervical   . History of kidney stones 05/2013  . Hyperlipidemia   . Reflux    Past Surgical History:  Procedure Laterality Date  . CESAREAN SECTION  1984  . CHOLECYSTECTOMY  2005  . CYSTECTOMY  2003  . hysteroscopic resection     hysteroscopic resection of polyp  . LITHOTRIPSY  04/2013   --Dr. Janice Norrie  . UTERINE CYST REMOVAL  2005   Current Outpatient Medications on File Prior to Visit  Medication Sig Dispense Refill  . diclofenac (VOLTAREN) 75 MG EC tablet TAKE 1 TABLET(75 MG) BY MOUTH TWICE DAILY AS NEEDED FOR JOINT PAIN 180 tablet 1  . diphenhydrAMINE (BENADRYL) 25 MG tablet Take 25 mg at bedtime as needed by mouth for sleep.    Marland Kitchen MELATONIN PO Take by mouth as needed.     No current facility-administered medications on file prior to visit.    No Known Allergies Family History  Problem Relation Age of Onset  . Diabetes Mother   . Heart disease Mother        CHF  . Vision loss Mother   . Cancer Father        TESTICULAR  . Stroke Father 81  . Hyperlipidemia Father   . Hyperlipidemia Maternal Grandmother   . Diabetes Maternal Grandmother   .  Heart attack Maternal Grandfather   . Cancer Paternal Grandmother   . Breast cancer Paternal Grandmother   . Cancer Paternal Grandfather    Social History   Socioeconomic History  . Marital status: Divorced    Spouse name: Not on file  . Number of children: Not on file  . Years of education: Not on file  . Highest education level: Not on file  Occupational History  . Occupation: Self Employed Oncologist: self employed  Social Needs  . Financial resource strain: Not on file  . Food insecurity:    Worry: Not on file    Inability: Not on file  . Transportation needs:    Medical: Not on file    Non-medical: Not on file  Tobacco Use  . Smoking status: Never Smoker  . Smokeless tobacco: Never Used  Substance and Sexual Activity  . Alcohol use: Yes    Comment: WINE OR BEER - 1 OR 2/month  . Drug use: No  . Sexual activity: Never    Partners: Male    Birth control/protection: Post-menopausal  Lifestyle  . Physical activity:    Days per week: Not on file    Minutes per session: Not on file  .  Stress: Not on file  Relationships  . Social connections:    Talks on phone: Not on file    Gets together: Not on file    Attends religious service: Not on file    Active member of club or organization: Not on file    Attends meetings of clubs or organizations: Not on file    Relationship status: Not on file  Other Topics Concern  . Not on file  Social History Narrative   Divorced. Education: [employer]. Exercise: Walk/Yoga 3 times a week for 30 minutes.   Depression screen [facility] 2/9 02/04/2018 06/16/2017 05/14/2016 01/08/2016 07/08/2015  Decreased Interest 0 0 0 0 0  Down, Depressed, Hopeless 0 0 0 0 0  PHQ - 2 Score 0 0 0 0 0   Review of Systems  Constitutional: Positive for activity change, appetite change (mild), diaphoresis, fatigue and fever. Negative for unexpected weight change.  HENT: Positive for sinus pain. Negative for congestion, ear pain, facial swelling, mouth  sores, nosebleeds, postnasal drip, rhinorrhea, sinus pressure, sore throat and trouble swallowing.   Eyes: Negative for photophobia.  Respiratory: Negative for cough, chest tightness, shortness of breath and wheezing.   Cardiovascular: Negative for chest pain, palpitations and leg swelling.  Gastrointestinal: Negative for abdominal pain, constipation, diarrhea and vomiting.  Endocrine: Negative for polydipsia, polyphagia and polyuria.  Genitourinary: Negative for difficulty urinating and hematuria.  Musculoskeletal: Positive for arthralgias, back pain, myalgias, neck pain and neck stiffness. Negative for gait problem and joint swelling.  Skin: Negative for color change, rash and wound.  Allergic/Immunologic: Negative for immunocompromised state.  Neurological: Negative for dizziness, syncope, speech difficulty and light-headedness.  Hematological: Negative for adenopathy. Does not bruise/bleed easily.      Objective:   Physical Exam  Constitutional: She is oriented to person, place, and time. She appears well-developed and well-nourished. No distress.  HENT:  Head: Normocephalic and atraumatic.  Right Ear: Tympanic membrane, external ear and ear canal normal.  Left Ear: Tympanic membrane, external ear and ear canal normal.  Nose: Nose normal. No mucosal edema or rhinorrhea.  Mouth/Throat: Uvula is midline. Mucous membranes are not pale and dry (dark bright red erythema, tongue with thick white adherent plaque throughout w/ little black flecks). Posterior oropharyngeal erythema present. No oropharyngeal exudate.  Eyes: Conjunctivae and EOM are normal. Right eye exhibits no discharge. Left eye exhibits no discharge. No scleral icterus.  Neck: Normal range of motion. Neck supple. No tracheal deviation present.  Cardiovascular: Normal rate, regular rhythm, normal heart sounds and intact distal pulses.  Pulmonary/Chest: Effort normal and breath sounds normal. No respiratory distress.    Abdominal: Soft. Bowel sounds are normal. She exhibits no distension and no mass. There is generalized tenderness (mild). There is no rebound and no guarding.  Musculoskeletal: Normal range of motion.  Lymphadenopathy:    She has no cervical adenopathy.  Neurological: She is alert and oriented to person, place, and time.  Skin: Skin is warm and dry. She is not diaphoretic. No erythema.  Psychiatric: She has a normal mood and affect. Her behavior is normal.  Nursing note and vitals reviewed.  BP 111/69   Pulse 73   Temp 99 F (37.2 C) (Oral)   Resp 16   Ht 5\' 5"  (1.651 m)   Wt 173 lb (78.5 kg)   LMP 08/03/1998   SpO2 96%   BMI 28.79 kg/m     Results for orders placed or performed in visit on 02/04/18  Comprehensive metabolic panel  Result Value Ref Range   Glucose 97 65 - 99 mg/dL   BUN 11 8 - 27 mg/dL   Creatinine, Ser 0.82 0.57 - 1.00 mg/dL   GFR calc non Af Amer 70 >59 mL/min/1.73   GFR calc Af Amer 81 >59 mL/min/1.73   BUN/Creatinine Ratio 13 12 - 28   Sodium 139 134 - 144 mmol/L   Potassium 4.3 3.5 - 5.2 mmol/L   Chloride 103 96 - 106 mmol/L   CO2 21 20 - 29 mmol/L   Calcium 9.3 8.7 - 10.3 mg/dL   Total Protein 6.8 6.0 - 8.5 g/dL   Albumin 4.1 3.5 - 4.8 g/dL   Globulin, Total 2.7 1.5 - 4.5 g/dL   Albumin/Globulin Ratio 1.5 1.2 - 2.2   Bilirubin Total 0.4 0.0 - 1.2 mg/dL   Alkaline Phosphatase 129 (H) 39 - 117 IU/L   AST 26 0 - 40 IU/L   ALT 30 0 - 32 IU/L  CK  Result Value Ref Range   Total CK WILL FOLLOW   POCT urinalysis dipstick  Result Value Ref Range   Color, UA yellow yellow   Clarity, UA clear clear   Glucose, UA negative negative mg/dL   Bilirubin, UA negative negative   Ketones, POC UA trace (5) (A) negative mg/dL   Spec Grav, UA >=1.030 (A) 1.010 - 1.025   Blood, UA trace-lysed (A) negative   pH, UA 5.0 5.0 - 8.0   Protein Ur, POC negative negative mg/dL   Urobilinogen, UA 0.2 0.2 or 1.0 E.U./dL   Nitrite, UA Negative Negative   Leukocytes,  UA Trace (A) Negative  POCT Microscopic Urinalysis ([facility])  Result Value Ref Range   WBC,UR,HPF,POC Few (A) None WBC/hpf   RBC,UR,HPF,POC None None RBC/hpf   Bacteria None None, Too numerous to count   Mucus Present (A) Absent   Epithelial Cells, UR Per Microscopy Few (A) None, Too numerous to count cells/hpf  POCT CBC  Result Value Ref Range   WBC 6.4 4.6 - 10.2 K/uL   Lymph, poc 2.5 0.6 - 3.4   POC LYMPH PERCENT 39.4 10 - 50 %L   MID (cbc) 0.3 0 - 0.9   POC MID % 4.2 0 - 12 %M   POC Granulocyte 3.6 2 - 6.9   Granulocyte percent 56.4 37 - 80 %G   RBC 5.31 4.04 - 5.48 M/uL   Hemoglobin 15.0 12.2 - 16.2 g/dL   HCT, POC 47.1 37.7 - 47.9 %   MCV 88.7 80 - 97 fL   MCH, POC 28.3 27 - 31.2 pg   MCHC 31.9 31.8 - 35.4 g/dL   RDW, POC 13.5 %   Platelet Count, POC 242 142 - 424 K/uL   MPV 7.5 0 - 99.8 fL  POCT glucose (manual entry)  Result Value Ref Range   POC Glucose 86 70 - 99 mg/dl  POCT SEDIMENTATION RATE  Result Value Ref Range   POCT SED RATE 23 (A) 0 - 22 mm/hr  POCT Skin KOH  Result Value Ref Range   Skin KOH, POC Positive (A) Negative  POCT rapid strep A  Result Value Ref Range   Rapid Strep A Screen Negative Negative   Assessment & Plan:   1. Fever, unspecified - suspect unknown viral illness - labs P (lyme ab per pt request). Pt does seem to be quite dehydrated as unable to provide urine sample for prolonged period (had to take home w/ her) and still very concentrated w/ small  ketones - r/o  rhabdo due to heat with CK.  If initial labs are unrevealing, may want to have pt come back for additional viral testing - EBV, CMV, CXR, resp viral swab (influenza, parainfluenza, etc)  2. Thrush, oral - breathes threw mouth at night so tongue and oral MM very dry but no other risk factors for thrush - trx w/ clotrimazole until cleared and an additional 2d - could check Ig, SPEP, HIV if think need to investigate potential causes further  3. Myalgia - poss 2/2 dehydration from fever  from viral?  4. Tick bite of back, initial encounter - pt concerned about lyme from tick >2 mos prior, no rash, no initial illness so VERY unlikely but pt would like to proceed w/ abx testing.  Reviewed that difficult diagnosis to make - negative labs does not completely r/o the possibility of active lyme disease.  5. Chronic fatigue     Orders Placed This Encounter  Procedures  . Urine Culture    Order Specific Question:   Source    Answer:   oropharynx  . Culture, Group A Strep    Order Specific Question:   Source    Answer:   oropharynx  . Comprehensive metabolic panel  . CK  . B. Burgdorfi Antibodies  . TSH  . Vitamin B12  . CK  . POCT urinalysis dipstick  . POCT Microscopic Urinalysis ([facility])  . POCT CBC  . POCT glucose (manual entry)  . POCT SEDIMENTATION RATE  . POCT Skin KOH  . POCT rapid strep A    Meds ordered this encounter  Medications  . clotrimazole (MYCELEX) 10 MG troche    Sig: Take 1 tablet (10 mg total) by mouth 5 (five) times daily. May d/c med 2 days after white plaque on tongue has completely resolved    Dispense:  70 tablet    Refill:  0    I personally performed the services described in this documentation, which was scribed in my presence. The recorded information has been reviewed and considered, and addended by me as needed.   [name], M.D.  Primary Care at [facility] [address] [place], [place] [address] [phone] phone [phone] fax  02/05/18 12:42 AM

## 2018-02-05 LAB — COMPREHENSIVE METABOLIC PANEL
ALT: 30 IU/L (ref 0–32)
AST: 26 IU/L (ref 0–40)
Albumin/Globulin Ratio: 1.5 (ref 1.2–2.2)
Albumin: 4.1 g/dL (ref 3.5–4.8)
Alkaline Phosphatase: 129 IU/L — ABNORMAL HIGH (ref 39–117)
BUN/Creatinine Ratio: 13 (ref 12–28)
BUN: 11 mg/dL (ref 8–27)
Bilirubin Total: 0.4 mg/dL (ref 0.0–1.2)
CO2: 21 mmol/L (ref 20–29)
CREATININE: 0.82 mg/dL (ref 0.57–1.00)
Calcium: 9.3 mg/dL (ref 8.7–10.3)
Chloride: 103 mmol/L (ref 96–106)
GFR, EST AFRICAN AMERICAN: 81 mL/min/{1.73_m2} (ref >59)
GFR, EST NON AFRICAN AMERICAN: 70 mL/min/{1.73_m2} (ref >59)
Globulin, Total: 2.7 g/dL (ref 1.5–4.5)
Glucose: 97 mg/dL (ref 65–99)
Potassium: 4.3 mmol/L (ref 3.5–5.2)
Sodium: 139 mmol/L (ref 134–144)
Total Protein: 6.8 g/dL (ref 6.0–8.5)

## 2018-02-05 LAB — URINE CULTURE

## 2018-02-05 LAB — CK: CK TOTAL: 60 U/L (ref 24–173)

## 2018-02-06 LAB — CULTURE, GROUP A STREP: STREP A CULTURE: NEGATIVE

## 2018-02-07 ENCOUNTER — Encounter: Payer: Self-pay | Admitting: Family Medicine

## 2018-02-07 LAB — B. BURGDORFI ANTIBODIES: Lyme IgG/IgM Ab: <0.91 {ISR} (ref 0.00–0.90)

## 2018-02-07 LAB — TSH: TSH: 2.25 u[IU]/mL (ref 0.450–4.500)

## 2018-02-07 LAB — VITAMIN B12: Vitamin B-12: 431 pg/mL (ref 232–1245)

## 2018-02-16 ENCOUNTER — Encounter: Payer: Self-pay | Admitting: Obstetrics and Gynecology

## 2018-02-16 ENCOUNTER — Ambulatory Visit (INDEPENDENT_AMBULATORY_CARE_PROVIDER_SITE_OTHER): Payer: Medicare Other | Admitting: Obstetrics and Gynecology

## 2018-02-16 ENCOUNTER — Other Ambulatory Visit (HOSPITAL_COMMUNITY)
Admission: RE | Admit: 2018-02-16 | Discharge: 2018-02-16 | Disposition: A | Discharge status: Home / Self Care | Payer: Medicare Other | Source: Ambulatory Visit | Attending: Obstetrics and Gynecology | Admitting: Obstetrics and Gynecology

## 2018-02-16 ENCOUNTER — Other Ambulatory Visit: Payer: Self-pay

## 2018-02-16 VITALS — BP 110/60 | HR 68 | Resp 14 | Ht 65.0 in | Wt 171.0 lb

## 2018-02-16 DIAGNOSIS — Z01419 Encounter for gynecological examination (general) (routine) without abnormal findings: Secondary | ICD-10-CM | POA: Diagnosis not present

## 2018-02-16 NOTE — Progress Notes (Signed)
77 y.o. [id number] Divorced Caucasian female here for annual exam.    Has urinary urgency and night time urination.  Drinking water reduces her symptoms.  One coffee per am.  No sodas.   No constipation.  Can feel some sensation like a hemorrhoid if walking a lot.   Traveling to [place].   PCP: Dr. [name]    Patient's last menstrual period was 08/03/1998.           Sexually active: No.  The current method of family planning is post menopausal status.    Exercising: Yes.    walking, dancing Smoker:  no  Health Maintenance: Pap:  01/24/16 Pap smear negative History of abnormal Pap:  no MMG:  01/27/18 BIRADS 1 negative/density b Colonoscopy:  01/2011 normal with Dr. Michail Sermon BMD:   2009  Result  normal TDaP:  2014 Gardasil:   n/a HIV: never Hep C: never Screening Labs:  PCP   reports that she has never smoked. She has never used smokeless tobacco. She reports that she drinks alcohol. She reports that she does not use drugs.  Past Medical History:  Diagnosis Date  . Arthritis   . DDD (degenerative disc disease), cervical   . History of kidney stones 05/2013  . Hyperlipidemia   . Reflux     Past Surgical History:  Procedure Laterality Date  . CESAREAN SECTION  1984  . CHOLECYSTECTOMY  2005  . CYSTECTOMY  2003  . hysteroscopic resection     hysteroscopic resection of polyp  . LITHOTRIPSY  04/2013   --Dr. Janice Norrie  . UTERINE CYST REMOVAL  2005    Current Outpatient Medications  Medication Sig Dispense Refill  . diclofenac (VOLTAREN) 75 MG EC tablet TAKE 1 TABLET(75 MG) BY MOUTH TWICE DAILY AS NEEDED FOR JOINT PAIN 180 tablet 1  . MELATONIN PO Take by mouth as needed.    . diphenhydrAMINE (BENADRYL) 25 MG tablet Take 25 mg at bedtime as needed by mouth for sleep.     No current facility-administered medications for this visit.     Family History  Problem Relation Age of Onset  . Diabetes Mother   . Heart disease Mother        CHF  . Vision loss Mother   . Cancer  Father        TESTICULAR  . Stroke Father 90  . Hyperlipidemia Father   . Hyperlipidemia Maternal Grandmother   . Diabetes Maternal Grandmother   . Heart attack Maternal Grandfather   . Cancer Paternal Grandmother   . Breast cancer Paternal Grandmother   . Cancer Paternal Grandfather     Review of Systems  Constitutional: Negative.   HENT: Negative.   Eyes: Negative.   Respiratory: Negative.   Cardiovascular: Negative.   Gastrointestinal: Negative.   Endocrine: Negative.   Genitourinary: Negative.   Musculoskeletal: Negative.   Skin: Negative.   Allergic/Immunologic: Negative.   Neurological: Negative.   Hematological: Negative.   Psychiatric/Behavioral: Negative.     Exam:   BP 110/60 (BP Location: Right Arm, Patient Position: Sitting, Cuff Size: Normal)   Pulse 68   Resp 14   Ht 5\' 5"  (1.651 m)   Wt 171 lb (77.6 kg)   LMP 08/03/1998   BMI 28.46 kg/m     General appearance: alert, cooperative and appears stated age Head: Normocephalic, without obvious abnormality, atraumatic Neck: no adenopathy, supple, symmetrical, trachea midline and thyroid normal to inspection and palpation Lungs: clear to auscultation bilaterally Breasts: normal appearance, no  masses or tenderness, No nipple retraction or dimpling, No nipple discharge or bleeding, No axillary or supraclavicular adenopathy Heart: regular rate and rhythm Abdomen: soft, non-tender; no masses, no organomegaly Extremities: extremities normal, atraumatic, no cyanosis or edema Skin: Skin color, texture, turgor normal. No rashes or lesions Lymph nodes: Cervical, supraclavicular, and axillary nodes normal. No abnormal inguinal nodes palpated Neurologic: Grossly normal  Pelvic: External genitalia:  no lesions              Urethra:  normal appearing urethra with no masses, tenderness or lesions              Bartholins and Skenes: normal                 Vagina: normal appearing vagina with normal color and discharge,  no lesions              Cervix: no lesions              Pap taken: Yes.   Bimanual Exam:  Uterus:  normal size, contour, position, consistency, mobility, non-tender              Adnexa: no mass, fullness, tenderness              Rectal exam: Yes.  .  Confirms.              Anus:  normal sphincter tone, no lesions  Chaperone was present for exam.  Assessment:   Well woman visit with normal exam. Second degree high rectocele.  Urinary urgency.   Plan: Mammogram screening. Recommended self breast awareness. Pap and HR HPV as above. Guidelines for Calcium, Vitamin D, regular exercise program including cardiovascular and weight bearing exercise. Potential overactive bladder discussed with patient.  Rectocele reviewed with patient.  Kegel's suggested.  I mentioned pelvic floor therapy as well. We discussed reducing straining to help avoid progression of rectocele.  Follow up annually and prn.   After visit summary provided.

## 2018-02-16 NOTE — Patient Instructions (Addendum)
EXERCISE AND DIET:  We recommended that you start or continue a regular exercise program for good health. Regular exercise means any activity that makes your heart beat faster and makes you sweat.  We recommend exercising at least 30 minutes per day at least 3 days a week, preferably 4 or 5.  We also recommend a diet low in fat and sugar.  Inactivity, poor dietary choices and obesity can cause diabetes, heart attack, stroke, and kidney damage, among others.    ALCOHOL AND SMOKING:  Women should limit their alcohol intake to no more than 7 drinks/beers/glasses of wine (combined, not each!) per week. Moderation of alcohol intake to this level decreases your risk of breast cancer and liver damage. And of course, no recreational drugs are part of a healthy lifestyle.  And absolutely no smoking or even second hand smoke. Most people know smoking can cause heart and lung diseases, but did you know it also contributes to weakening of your bones? Aging of your skin?  Yellowing of your teeth and nails?  CALCIUM AND VITAMIN D:  Adequate intake of calcium and Vitamin D are recommended.  The recommendations for exact amounts of these supplements seem to change often, but generally speaking 600 mg of calcium (either carbonate or citrate) and 800 units of Vitamin D per day seems prudent. Certain women may benefit from higher intake of Vitamin D.  If you are among these women, your doctor will have told you during your visit.    PAP SMEARS:  Pap smears, to check for cervical cancer or precancers,  have traditionally been done yearly, although recent scientific advances have shown that most women can have pap smears less often.  However, every woman still should have a physical exam from her gynecologist every year. It will include a breast check, inspection of the vulva and vagina to check for abnormal growths or skin changes, a visual exam of the cervix, and then an exam to evaluate the size and shape of the uterus and  ovaries.  And after 76 years of age, a rectal exam is indicated to check for rectal cancers. We will also provide age appropriate advice regarding health maintenance, like when you should have certain vaccines, screening for sexually transmitted diseases, bone density testing, colonoscopy, mammograms, etc.   MAMMOGRAMS:  All women over 40 years old should have a yearly mammogram. Many facilities now offer a "3D" mammogram, which may cost around $50 extra out of pocket. If possible,  we recommend you accept the option to have the 3D mammogram performed.  It both reduces the number of women who will be called back for extra views which then turn out to be normal, and it is better than the routine mammogram at detecting truly abnormal areas.    COLONOSCOPY:  Colonoscopy to screen for colon cancer is recommended for all women at age 50.  We know, you hate the idea of the prep.  We agree, BUT, having colon cancer and not knowing it is worse!!  Colon cancer so often starts as a polyp that can be seen and removed at colonscopy, which can quite literally save your life!  And if your first colonoscopy is normal and you have no family history of colon cancer, most women don't have to have it again for 10 years.  Once every ten years, you can do something that may end up saving your life, right?  We will be happy to help you get it scheduled when you are ready.    Be sure to check your insurance coverage so you understand how much it will cost.  It may be covered as a preventative service at no cost, but you should check your particular policy.     About Rectocele  Overview  A rectocele is a type of hernia which causes different degrees of bulging of the rectal tissues into the vaginal wall.  You may even notice that it presses against the vaginal wall so much that some vaginal tissues droop outside of the opening of your vagina.  Causes of Rectocele  The most common cause is childbirth.  The muscles and ligaments  in the pelvis that hold up and support the female organs and vagina become stretched and weakened during labor and delivery.  The more babies you have, the more the support tissues are stretched and weakened.  Not everyone who has a baby will develop a rectocele.  Some women have stronger supporting tissue in the pelvis and may not have as much of a problem as others.  Women who have a Cesarean section usually do not get rectocele's unless they pushed a long time prior to the cesarean delivery.  Other conditions that can cause a rectocele include chronic constipation, a chronic cough, a lot of heavy lifting, and obesity.  Older women may have this problem because the loss of female hormones causes the vaginal tissue to become weaker.  Symptoms  There may not be any symptoms.  If you do have symptoms, they may include: Pelvic pressure in the rectal area Protrusion of the lower part of the vagina through the opening of the vagina Constipation and trapping of the stool, making it difficult to have a bowel movement.  In severe cases, you may have to press on the lower part of your vagina to help push the stool out of you rectum.  This is called splinting to empty.  Diagnosing Rectocele  Your health care provider will ask about your symptoms and perform a pelvic exam.  S/he will ask you to bear down, pushing like you are having a bowel movement so as to see how far the lower part of the vagina protrudes into the vagina and possible outside of the vagina.  Your provider will also ask you to contract the muscles of your pelvis (like you are stopping the stream in the middle of urinating) to determine the strength of your pelvic muscles.  Your provider may also do a rectal exam.  Treatment Options  If you do not have any symptoms, no treatment may be necessary.  Other treatment options include: Pelvic floor exercises: Contracting the muscles in your genital area may help strengthen your muscles and support  the organs.  Be sure to get proper exercise instruction from you physical therapist. A pessary (removealbe pelvic support device) sometimes helps rectocele symptoms. Surgery: Surgical repair may be necessary. In some cases the uterus may need to be taken out ( a hysterectomy) as well.  There are many types of surgery for pelvic support problems.  Look for physicians who specialize in repair procedures.  You can take care of yourself by: Treating and preventing constipation Avoiding heavy lifting, and lifting correctly (with your legs, not with you waist or back) Treating a chronic cough or bronchitis Not smoking avoiding too much weight gain Doing pelvic floor exercises   2007, [employer] Doc.33  Kegel Exercises Kegel exercises help strengthen the muscles that support the rectum, vagina, small intestine, bladder, and uterus. Doing Kegel exercises can help:  Improve bladder and bowel control.  Improve sexual response.  Reduce problems and discomfort during pregnancy.  Kegel exercises involve squeezing your pelvic floor muscles, which are the same muscles you squeeze when you try to stop the flow of urine. The exercises can be done while sitting, standing, or lying down, but it is best to vary your position. Phase 1 exercises 1. Squeeze your pelvic floor muscles tight. You should feel a tight lift in your rectal area. If you are a female, you should also feel a tightness in your vaginal area. Keep your stomach, buttocks, and legs relaxed. 2. Hold the muscles tight for up to 10 seconds. 3. Relax your muscles. Repeat this exercise 50 times a day or as many times as told by your health care provider. Continue to do this exercise for at least 4-6 weeks or for as long as told by your health care provider. This information is not intended to replace advice given to you by your health care provider. Make sure you discuss any questions you have with your health care  provider. Document Released: 07/06/2012 Document Revised: 03/14/2016 Document Reviewed: 06/09/2015 Elsevier Interactive Patient Education  [employer].

## 2018-02-17 LAB — CYTOLOGY - PAP: DIAGNOSIS: NEGATIVE

## 2018-03-29 ENCOUNTER — Other Ambulatory Visit: Payer: Self-pay | Admitting: Family Medicine

## 2018-03-29 NOTE — Progress Notes (Signed)
Adding R68.89 OTHER GENERAL SYMPTOMS AND SIGNS to vitamin b12 lab order

## 2018-04-19 DIAGNOSIS — H43813 Vitreous degeneration, bilateral: Secondary | ICD-10-CM | POA: Diagnosis not present

## 2018-04-19 DIAGNOSIS — H04123 Dry eye syndrome of bilateral lacrimal glands: Secondary | ICD-10-CM | POA: Diagnosis not present

## 2018-04-19 DIAGNOSIS — H4322 Crystalline deposits in vitreous body, left eye: Secondary | ICD-10-CM | POA: Diagnosis not present

## 2018-04-19 DIAGNOSIS — H2513 Age-related nuclear cataract, bilateral: Secondary | ICD-10-CM | POA: Diagnosis not present

## 2018-05-10 ENCOUNTER — Other Ambulatory Visit: Payer: Self-pay | Admitting: *Deleted

## 2018-05-10 ENCOUNTER — Encounter: Payer: Self-pay | Admitting: Family Medicine

## 2018-05-10 DIAGNOSIS — J3089 Other allergic rhinitis: Secondary | ICD-10-CM

## 2018-05-10 MED ORDER — LORATADINE 10 MG PO TABS: 10.0000 mg | ORAL_TABLET | Freq: Every day | ORAL | 11 refills | Status: AC

## 2018-05-13 ENCOUNTER — Ambulatory Visit (INDEPENDENT_AMBULATORY_CARE_PROVIDER_SITE_OTHER): Payer: Medicare Other | Admitting: Family Medicine

## 2018-05-13 DIAGNOSIS — Z23 Encounter for immunization: Secondary | ICD-10-CM

## 2018-05-27 ENCOUNTER — Other Ambulatory Visit: Payer: Self-pay | Admitting: Family Medicine

## 2018-06-10 ENCOUNTER — Telehealth: Payer: Self-pay | Admitting: Obstetrics and Gynecology

## 2018-06-10 NOTE — Telephone Encounter (Signed)
Left message on voicemail to call and reschedule cancelled appointment. °

## 2018-06-20 ENCOUNTER — Other Ambulatory Visit: Payer: Self-pay

## 2018-06-20 ENCOUNTER — Ambulatory Visit (INDEPENDENT_AMBULATORY_CARE_PROVIDER_SITE_OTHER): Payer: Medicare Other | Admitting: Family Medicine

## 2018-06-20 ENCOUNTER — Encounter: Payer: Self-pay | Admitting: Family Medicine

## 2018-06-20 VITALS — BP 108/65 | HR 54 | Temp 98.6°F | Resp 16 | Ht 65.35 in | Wt 172.0 lb

## 2018-06-20 DIAGNOSIS — Z0001 Encounter for general adult medical examination with abnormal findings: Secondary | ICD-10-CM | POA: Diagnosis not present

## 2018-06-20 DIAGNOSIS — E2839 Other primary ovarian failure: Secondary | ICD-10-CM | POA: Diagnosis not present

## 2018-06-20 DIAGNOSIS — R1013 Epigastric pain: Secondary | ICD-10-CM

## 2018-06-20 DIAGNOSIS — R1012 Left upper quadrant pain: Secondary | ICD-10-CM

## 2018-06-20 DIAGNOSIS — E7800 Pure hypercholesterolemia, unspecified: Secondary | ICD-10-CM | POA: Diagnosis not present

## 2018-06-20 DIAGNOSIS — G4700 Insomnia, unspecified: Secondary | ICD-10-CM

## 2018-06-20 DIAGNOSIS — Z0000 Encounter for general adult medical examination without abnormal findings: Secondary | ICD-10-CM

## 2018-06-20 DIAGNOSIS — E663 Overweight: Secondary | ICD-10-CM

## 2018-06-20 LAB — POCT URINALYSIS DIP (MANUAL ENTRY)
BILIRUBIN UA: NEGATIVE
BILIRUBIN UA: NEGATIVE mg/dL
Blood, UA: NEGATIVE
GLUCOSE UA: NEGATIVE mg/dL
Nitrite, UA: NEGATIVE
PH UA: 5.5 (ref 5.0–8.0)
PROTEIN UA: NEGATIVE mg/dL
Spec Grav, UA: 1.025 (ref 1.010–1.025)
Urobilinogen, UA: 0.2 E.U./dL

## 2018-06-20 MED ORDER — ZOSTER VAC RECOMB ADJUVANTED 50 MCG/0.5ML IM SUSR: 0.5000 mL | Freq: Once | INTRAMUSCULAR | 0 refills | Status: AC

## 2018-06-20 NOTE — Progress Notes (Signed)
Subjective:   Cynthia Castro is a 77 y.o. female who presents for Medicare Annual (Subsequent) preventive examination.  Review of Systems:  Still has bad digestion problems with LUQ sharp pain when she eats poorly - lots of chocolate or eat to much, to fast, to often. Pain radiates across epigastrium to RUQ. She tried med for gastritis rx'd from me which did help her sxs but was able to stop and not needed as she changed her eating habits but then whenever she makes a poor choice, sxs return.  Wonders if there is a test for pancrease  Thinks she has OSA but still gets good sleep as not tired during the day.  She wakes herself up overnight with a noise or needing to breathe but no a.m. HAs.  Declines sleep study.   She wonders what level of cholesterol would be necessary for her to start on a medication       Objective:     Vitals: BP 108/65   Pulse (!) 54   Temp 98.6 F (37 C) (Oral)   Resp 16   Ht 5' 5.35" (1.66 m)   Wt 172 lb (78 kg)   LMP 08/03/1998   SpO2 97%   BMI 28.31 kg/m   Body mass index is 28.31 kg/m.  Advanced Directives 06/20/2018 05/29/2013  Does Patient Have a Medical Advance Directive? Yes Patient does not have advance directive;Patient would not like information  Type of Scientist, forensic Power of [place];Living will -  Does patient want to make changes to medical advance directive? No - Patient declined -  Copy of [place] in Chart? No - copy requested -  Pre-existing out of facility DNR order (yellow form or pink MOST form) - No  HCPOA is daughter Cynthia Castro in [place] History   Tobacco Use  Smoking Status Never Smoker  Smokeless Tobacco Never Used     Counseling given: Not Answered   Clinical Intake:   Past Medical History:  Diagnosis Date  . Arthritis   . DDD (degenerative disc disease), cervical   . History of kidney stones 05/2013  . Hyperlipidemia   . Reflux    Past Surgical  History:  Procedure Laterality Date  . CESAREAN SECTION  1984  . CHOLECYSTECTOMY  2005  . CYSTECTOMY  2003  . hysteroscopic resection     hysteroscopic resection of polyp  . LITHOTRIPSY  04/2013   --Dr. Janice Norrie  . UTERINE CYST REMOVAL  2005   Family History  Problem Relation Age of Onset  . Diabetes Mother   . Heart disease Mother        CHF  . Vision loss Mother   . Cancer Father        TESTICULAR  . Stroke Father 33  . Hyperlipidemia Father   . Hyperlipidemia Maternal Grandmother   . Diabetes Maternal Grandmother   . Heart attack Maternal Grandfather   . Cancer Paternal Grandmother   . Breast cancer Paternal Grandmother   . Cancer Paternal Grandfather    Social History   Socioeconomic History  . Marital status: Divorced    Spouse name: Not on file  . Number of children: Not on file  . Years of education: Not on file  . Highest education level: Not on file  Occupational History  . Occupation: Self Employed Oncologist: self employed  Social Needs  . Financial resource strain: Not on file  . Food insecurity:  Worry: Not on file    Inability: Not on file  . Transportation needs:    Medical: Not on file    Non-medical: Not on file  Tobacco Use  . Smoking status: Never Smoker  . Smokeless tobacco: Never Used  Substance and Sexual Activity  . Alcohol use: Yes    Comment: WINE OR BEER - 1 OR 2/month  . Drug use: No  . Sexual activity: Not Currently    Partners: Male    Birth control/protection: Post-menopausal  Lifestyle  . Physical activity:    Days per week: Not on file    Minutes per session: Not on file  . Stress: Not on file  Relationships  . Social connections:    Talks on phone: Not on file    Gets together: Not on file    Attends religious service: Not on file    Active member of club or organization: Not on file    Attends meetings of clubs or organizations: Not on file    Relationship status: Not on file  Other Topics Concern  . Not on  file  Social History Narrative   Divorced. Education: [employer]. Exercise: Walk/Yoga 3 times a week for 30 minutes.    Outpatient Encounter Medications as of 06/20/2018  Medication Sig  . diclofenac (VOLTAREN) 75 MG EC tablet TAKE 1 TABLET(75 MG) BY MOUTH TWICE DAILY AS NEEDED FOR JOINT PAIN  . diphenhydrAMINE (BENADRYL) 25 MG tablet Take 25 mg at bedtime as needed by mouth for sleep.  Marland Kitchen loratadine (CLARITIN) 10 MG tablet Take 1 tablet (10 mg total) by mouth daily.  Marland Kitchen MELATONIN PO Take by mouth as needed.  . [DISCONTINUED] diclofenac (VOLTAREN) 75 MG EC tablet TAKE 1 TABLET(75 MG) BY MOUTH TWICE DAILY AS NEEDED FOR JOINT PAIN   No facility-administered encounter medications on file as of 06/20/2018.     Activities of Daily Living In your present state of health, do you have any difficulty performing the following activities: 06/20/2018  Hearing? Y  Vision? N  Difficulty concentrating or making decisions? N  Walking or climbing stairs? N  Dressing or bathing? N  Doing errands, shopping? N  Some recent data might be hidden    Patient Care Team: Shawnee Knapp, MD as PCP - General (Family Medicine) Wilford Corner, MD (Gastroenterology) Yisroel Ramming, Everardo All, MD as Consulting Physician (Obstetrics and Gynecology) Clent Jacks, MD as Consulting Physician (Ophthalmology) Lavonna Monarch, MD as Consulting Physician (Dermatology)    Assessment:   This is a routine wellness examination for Cynthia Castro.  Exercise Activities and Dietary recommendations  Is decreased walking from 0-1 time/wk in [place] and stopped water aerobics as to tired and busy.  Goals   None     Fall Risk Fall Risk  06/20/2018 02/04/2018 06/16/2017 05/14/2016 01/08/2016  Falls in the past year? 0 No No No No  Number falls in past yr: 0 - - - -  Injury with Fall? 0 - - - -   Is the patient's home free of loose throw rugs in walkways, pet beds, electrical cords, etc?   yes      Grab bars in the bathroom? no       Handrails on the stairs?   yes      Adequate lighting?   yes  Timed Get Up and Go performed: Normal  Depression Screen PHQ 2/9 Scores 06/20/2018 02/04/2018 06/16/2017 05/14/2016  PHQ - 2 Score 0 0 0 0     Cognitive Function  6CIT Screen 06/20/2018  What Year? 0 points  What month? 0 points  What time? 0 points  Count back from 20 0 points  Months in reverse 0 points  Repeat phrase 0 points  Total Score 0    Immunization History  Administered Date(s) Administered  . Influenza, High Dose Seasonal PF 05/12/2017, 05/13/2018  . Influenza,inj,Quad PF,6+ Mos 05/05/2013, 05/08/2014, 05/09/2015, 05/14/2016  . Pneumococcal Conjugate-13 05/09/2015  . Pneumococcal Polysaccharide-23 04/22/2007  . Td 03/02/2002  . Tdap 12/22/2012    Qualifies for Shingles Vaccine?Yes - thinks she has zostavax prior but not sure and not in chart  Screening Tests Health Maintenance  Topic Date Due  . TETANUS/TDAP  12/23/2022  . INFLUENZA VACCINE  Completed  . DEXA SCAN  Completed  . PNA vac Low Risk Adult  Completed   Epworth sleepiness scale 7 STOP-BANG 2 - low risk  Cancer Screenings: Lung: Low Dose CT Chest recommended if Age 38-80 years, 30 pack-year currently smoking OR have quit w/in 15years. Patient does not qualify. Breast:  Up to date on Mammogram? Yes  01/2018 Up to date of Bone Density/Dexa? No none since 2009 - repeat  Colorectal: done ~4 yrs ago by Dr. Michail Sermon - aged out  Additional Screenings: : Hepatitis C Screening: Not done as not in "baby boomer" birth year  Physical Exam  Constitutional: She is oriented to person, place, and time. She appears well-developed and well-nourished. No distress.  HENT:  Head: Normocephalic and atraumatic.  Right Ear: Tympanic membrane, external ear and ear canal normal.  Left Ear: Tympanic membrane, external ear and ear canal normal.  Nose: Nose normal. No mucosal edema or rhinorrhea.  Mouth/Throat: Uvula is midline, oropharynx is clear  and moist and mucous membranes are normal. No posterior oropharyngeal erythema.  Eyes: Pupils are equal, round, and reactive to light. Conjunctivae and EOM are normal. Right eye exhibits no discharge. Left eye exhibits no discharge. No scleral icterus.  Neck: Normal range of motion. Neck supple. No thyromegaly present.  Cardiovascular: Normal rate, regular rhythm, normal heart sounds and intact distal pulses.  Pulmonary/Chest: Effort normal and breath sounds normal. No respiratory distress.  Abdominal: Soft. Bowel sounds are normal. There is no tenderness.  Musculoskeletal: She exhibits no edema.  Lymphadenopathy:    She has no cervical adenopathy.  Neurological: She is alert and oriented to person, place, and time. She has normal reflexes.  Skin: Skin is warm and dry. She is not diaphoretic. No erythema.  Psychiatric: She has a normal mood and affect. Her behavior is normal.   Plan:     1. Medicare annual wellness visit, subsequent   2. Left upper quadrant pain   3. Hypercholesteremia   4. Estrogen deficiency   5. Frequent nocturnal awakening   6. Epigastric pressure   7. Overweight (BMI 25.0-29.9)     Orders Placed This Encounter  Procedures  . US Abdomen Complete    Epic order Wt 170 / no needs Ins - [facility] bl/pt    Standing Status:   Future    Number of Occurrences:   1    Standing Expiration Date:   08/21/2019    Order Specific Question:   Reason for Exam (SYMPTOM  OR DIAGNOSIS REQUIRED)    Answer:   LUQ ABD PAIN RADIATING TO RIGHT AFTER EATING FOR YEARS    Order Specific Question:   Preferred imaging location?    Answer:   [facility]  . DG Bone Density    Standing  Status:   Future    Standing Expiration Date:   08/21/2019    Order Specific Question:   Reason for Exam (SYMPTOM  OR DIAGNOSIS REQUIRED)    Answer:   estrogen deficiency    Order Specific Question:   Preferred imaging location?    Answer:   [facility]  . Lipase  . Comprehensive  metabolic panel    Order Specific Question:   Has the patient fasted?    Answer:   Yes  . Lipid panel    Order Specific Question:   Has the patient fasted?    Answer:   Yes  . CBC with Differential/Platelet  . POCT urinalysis dipstick    Meds ordered this encounter  Medications  . Zoster Vaccine Adjuvanted [facility]) injection    Sig: Inject 0.5 mLs into the muscle once for 1 dose.    Dispense:  0.5 mL    Refill:  0    I have personally reviewed and noted the following in the patient's chart:   . Medical and social history . Use of alcohol, tobacco or illicit drugs  . Current medications and supplements . Functional ability and status . Nutritional status . Physical activity . Advanced directives . List of other physicians . Hospitalizations, surgeries, and ER visits in previous 12 months . Vitals . Screenings to include cognitive, depression, and falls . Referrals and appointments  In addition, I have reviewed and discussed with patient certain preventive protocols, quality metrics, and best practice recommendations. A written personalized care plan for preventive services as well as general preventive health recommendations were provided to patient.     Shawnee Knapp, MD  06/20/2018  [name], MD, MPH [facility] [address] [place], [place]  [phone] Office phone  [phone] Office fax   06/20/18 8:41 AM

## 2018-06-20 NOTE — Patient Instructions (Addendum)
You need to schedule your DEXA bone scan.  Please call [facility] at [phone] to schedule.   If you have lab work done today you will be contacted with your lab results within the next 2 weeks.  If you have not heard from [place] then please contact us. The fastest way to get your results is to register for My Chart.   IF you received an x-ray today, you will receive an invoice from [facility]. Please contact [facility] at [phone] with questions or concerns regarding your invoice.   IF you received labwork today, you will receive an invoice from [facility]. Please contact [facility] at [phone] with questions or concerns regarding your invoice.   Our billing staff will not be able to assist you with questions regarding bills from these companies.  You will be contacted with the lab results as soon as they are available. The fastest way to get your results is to activate your My Chart account. Instructions are located on the last page of this paperwork. If you have not heard from [place] regarding the results in 2 weeks, please contact this office.    Bone Health Bones protect organs, store calcium, and anchor muscles. Good health habits, such as eating nutritious foods and exercising regularly, are important for maintaining healthy bones. They can also help to prevent a condition that causes bones to lose density and become weak and brittle (osteoporosis). Why is bone mass important? Bone mass refers to the amount of bone tissue that you have. The higher your bone mass, the stronger your bones. An important step toward having healthy bones throughout life is to have strong and dense bones during childhood. A young adult who has a high bone mass is more likely to have a high bone mass later in life. Bone mass at its greatest it is called peak bone mass. A large decline in bone mass occurs in older adults. In women, it occurs about the time of  menopause. During this time, it is important to practice good health habits, because if more bone is lost than what is replaced, the bones will become less healthy and more likely to break (fracture). If you find that you have a low bone mass, you may be able to prevent osteoporosis or further bone loss by changing your diet and lifestyle. How can I find out if my bone mass is low? Bone mass can be measured with an X-ray test that is called a bone mineral density (BMD) test. This test is recommended for all women who are age 27 or older. It may also be recommended for men who are age 29 or older, or for people who are more likely to develop osteoporosis due to:  Having bones that break easily.  Having a long-term disease that weakens bones, such as kidney disease or rheumatoid arthritis.  Having menopause earlier than normal.  Taking medicine that weakens bones, such as steroids, thyroid hormones, or hormone treatment for breast cancer or prostate cancer.  Smoking.  Drinking three or more alcoholic drinks each day.  What are the nutritional recommendations for healthy bones? To have healthy bones, you need to get enough of the right minerals and vitamins. Most nutrition experts recommend getting these nutrients from the foods that you eat. Nutritional recommendations vary from person to person. Ask your health care provider what is healthy for you. Here are some general guidelines. Calcium Recommendations Calcium is the most important (essential) mineral for bone health.  Most people can get enough calcium from their diet, but supplements may be recommended for people who are at risk for osteoporosis. Good sources of calcium include:  Dairy products, such as low-fat or nonfat milk, cheese, and yogurt.  Dark green leafy vegetables, such as bok choy and broccoli.  Calcium-fortified foods, such as orange juice, cereal, bread, soy beverages, and tofu products.  Nuts, such as almonds.  Follow  these recommended amounts for daily calcium intake:  Children, age 76?3: 700 mg.  Children, age 54?8: 1,000 mg.  Children, age 58?13: 1,300 mg.  Teens, age 29?18: 1,300 mg.  Adults, age 36?50: 1,000 mg.  Adults, age 2?70: ? Men: 1,000 mg. ? Women: 1,200 mg.  Adults, age 769 or older: 1,200 mg.  Pregnant and breastfeeding females: ? Teens: 1,300 mg. ? Adults: 1,000 mg.  Vitamin D Recommendations Vitamin D is the most essential vitamin for bone health. It helps the body to absorb calcium. Sunlight stimulates the skin to make vitamin D, so be sure to get enough sunlight. If you live in a cold climate or you do not get outside often, your health care provider may recommend that you take vitamin D supplements. Good sources of vitamin D in your diet include:  Egg yolks.  Saltwater fish.  Milk and cereal fortified with vitamin D.  Follow these recommended amounts for daily vitamin D intake:  Children and teens, age 545?18: 63 international units.  Adults, age 57 or younger: 400-800 international units.  Adults, age 53 or older: 800-1,000 international units.  Other Nutrients Other nutrients for bone health include:  Phosphorus. This mineral is found in meat, poultry, dairy foods, nuts, and legumes. The recommended daily intake for adult men and adult women is 700 mg.  Magnesium. This mineral is found in seeds, nuts, dark green vegetables, and legumes. The recommended daily intake for adult men is 400?420 mg. For adult women, it is 310?320 mg.  Vitamin K. This vitamin is found in green leafy vegetables. The recommended daily intake is 120 mg for adult men and 90 mg for adult women.  What type of physical activity is best for building and maintaining healthy bones? Weight-bearing and strength-building activities are important for building and maintaining peak bone mass. Weight-bearing activities cause muscles and bones to work against gravity. Strength-building activities  increases muscle strength that supports bones. Weight-bearing and muscle-building activities include:  Walking and hiking.  Jogging and running.  Dancing.  Gym exercises.  Lifting weights.  Tennis and racquetball.  Climbing stairs.  Aerobics.  Adults should get at least 30 minutes of moderate physical activity on most days. Children should get at least 60 minutes of moderate physical activity on most days. Ask your health care provide what type of exercise is best for you. Where can I find more information? For more information, check out the following websites:  [place]: YardHomes.se  [employer] of Health: http://www.niams.AnonymousEar.fr.asp  This information is not intended to replace advice given to you by your health care provider. Make sure you discuss any questions you have with your health care provider. Document Released: 10/10/2003 Document Revised: 02/07/2016 Document Reviewed: 07/25/2014 Elsevier Interactive Patient Education  [employer].

## 2018-06-21 ENCOUNTER — Telehealth: Payer: Self-pay | Admitting: Family Medicine

## 2018-06-21 LAB — CBC WITH DIFFERENTIAL/PLATELET
BASOS ABS: 0.1 10*3/uL (ref 0.0–0.2)
Basos: 1 %
EOS (ABSOLUTE): 0.3 10*3/uL (ref 0.0–0.4)
Eos: 5 %
HEMOGLOBIN: 14.7 g/dL (ref 11.1–15.9)
Hematocrit: 45.9 % (ref 34.0–46.6)
IMMATURE GRANS (ABS): 0 10*3/uL (ref 0.0–0.1)
IMMATURE GRANULOCYTES: 0 %
LYMPHS: 44 %
Lymphocytes Absolute: 2.5 10*3/uL (ref 0.7–3.1)
MCH: 29.1 pg (ref 26.6–33.0)
MCHC: 32 g/dL (ref 31.5–35.7)
MCV: 91 fL (ref 79–97)
MONOCYTES: 9 %
Monocytes Absolute: 0.5 10*3/uL (ref 0.1–0.9)
NEUTROS ABS: 2.4 10*3/uL (ref 1.4–7.0)
Neutrophils: 41 %
Platelets: 270 10*3/uL (ref 150–450)
RBC: 5.05 x10E6/uL (ref 3.77–5.28)
RDW: 13.2 % (ref 12.3–15.4)
WBC: 5.8 10*3/uL (ref 3.4–10.8)

## 2018-06-21 LAB — COMPREHENSIVE METABOLIC PANEL
A/G RATIO: 2.3 — AB (ref 1.2–2.2)
ALT: 12 IU/L (ref 0–32)
AST: 13 IU/L (ref 0–40)
Albumin: 4.6 g/dL (ref 3.5–4.8)
Alkaline Phosphatase: 94 IU/L (ref 39–117)
BUN/Creatinine Ratio: 27 (ref 12–28)
BUN: 17 mg/dL (ref 8–27)
Bilirubin Total: 0.5 mg/dL (ref 0.0–1.2)
CO2: 21 mmol/L (ref 20–29)
Calcium: 8.7 mg/dL (ref 8.7–10.3)
Chloride: 105 mmol/L (ref 96–106)
Creatinine, Ser: 0.64 mg/dL (ref 0.57–1.00)
GFR calc Af Amer: 100 mL/min/{1.73_m2} (ref >59)
GFR calc non Af Amer: 87 mL/min/{1.73_m2} (ref >59)
GLOBULIN, TOTAL: 2 g/dL (ref 1.5–4.5)
Glucose: 76 mg/dL (ref 65–99)
Potassium: 4.6 mmol/L (ref 3.5–5.2)
SODIUM: 141 mmol/L (ref 134–144)
Total Protein: 6.6 g/dL (ref 6.0–8.5)

## 2018-06-21 LAB — LIPID PANEL
CHOL/HDL RATIO: 3.7 ratio (ref 0.0–4.4)
CHOLESTEROL TOTAL: 185 mg/dL (ref 100–199)
HDL: 50 mg/dL (ref >39)
LDL Calculated: 112 mg/dL — ABNORMAL HIGH (ref 0–99)
TRIGLYCERIDES: 116 mg/dL (ref 0–149)
VLDL Cholesterol Cal: 23 mg/dL (ref 5–40)

## 2018-06-21 LAB — LIPASE: LIPASE: 47 U/L (ref 14–85)

## 2018-06-21 NOTE — Telephone Encounter (Signed)
Peter Congo called [facility] they ok'd it with diagnosis

## 2018-06-21 NOTE — Telephone Encounter (Signed)
Copied from [place] [phone]. Topic: General - Inquiry >> Jun 20, 2018 12:29 PM Vernona Rieger wrote: Reason for CRM: Melody with [facility] called and said that Dr Brigitte Pulse put in an order for an ultra sound abdomen. She needs a different diagnosis other than " pain ". She said please upload to epic. Thanks

## 2018-06-22 ENCOUNTER — Encounter: Payer: Self-pay | Admitting: Family Medicine

## 2018-06-28 ENCOUNTER — Ambulatory Visit
Admission: RE | Admit: 2018-06-28 | Discharge: 2018-06-28 | Disposition: A | Discharge status: Home / Self Care | Payer: Medicare Other | Source: Ambulatory Visit | Attending: Family Medicine | Admitting: Family Medicine

## 2018-06-28 DIAGNOSIS — R1012 Left upper quadrant pain: Secondary | ICD-10-CM | POA: Diagnosis not present

## 2018-07-14 ENCOUNTER — Ambulatory Visit
Admission: RE | Admit: 2018-07-14 | Discharge: 2018-07-14 | Disposition: A | Discharge status: Home / Self Care | Payer: Medicare Other | Source: Ambulatory Visit | Attending: Family Medicine | Admitting: Family Medicine

## 2018-07-14 DIAGNOSIS — E2839 Other primary ovarian failure: Secondary | ICD-10-CM

## 2018-07-14 DIAGNOSIS — M85852 Other specified disorders of bone density and structure, left thigh: Secondary | ICD-10-CM | POA: Diagnosis not present

## 2018-07-14 DIAGNOSIS — Z780 Asymptomatic menopausal state: Secondary | ICD-10-CM | POA: Diagnosis not present

## 2018-10-04 ENCOUNTER — Telehealth: Payer: Self-pay | Admitting: Family Medicine

## 2018-10-04 NOTE — Telephone Encounter (Signed)
mychart message sent to pt about their appointment with Dr Shaw °

## 2018-11-01 ENCOUNTER — Telehealth: Payer: Medicare Other | Admitting: Physician Assistant

## 2018-11-01 DIAGNOSIS — R109 Unspecified abdominal pain: Secondary | ICD-10-CM

## 2018-11-01 NOTE — Progress Notes (Signed)
Based on what you shared with me, I feel your condition warrants further evaluation and I recommend that you be seen for a face to face office visit. Mostly, you need to sit down and talk about these symptoms without someone and then you need to be examined.       NOTE: If you entered your credit card information for this eVisit, you will not be charged. You may see a "hold" on your card for the $35 but that hold will drop off and you will not have a charge processed.  If you are having a true medical emergency please call 911.  If you need an urgent face to face visit, [facility] has four urgent care centers for your convenience.    PLEASE NOTE: THE [facility] LOCATIONS AND URGENT CARE CLINICS DO NOT HAVE THE TESTING FOR CORONAVIRUS COVID19 AVAILABLE.  IF YOU FEEL YOU NEED THIS TEST YOU MUST GO TO A TRIAGE LOCATION AT [facility] ?  [email or web address] to reserve your spot online an avoid wait times  [facility] [address] [place], [place] [address] 8 am to 8 pm Monday-Friday 10 am to 4 pm Saturday-Sunday *Across the street from [employer]  [place], [address] 8 am to 5 pm Monday-Friday * In the [facility] on the [facility]   The following sites will take your insurance:  [facility] a Provider at this Location  [place], [place] [address] 10 am to 8 pm Monday-Friday 12 pm to 8 pm [facility] [place] a Provider at this Location  [place], [place], [place] [address] 8 am to 8 pm Monday-Friday 9 am to 6 pm Saturday 11 am to 6 pm Sunday   [facility]  [phone] Get Driving Directions  [address] [place], [place]  [address] 8 am to 8 pm Monday-Friday 8 am to 4 pm Saturday-Sunday   Your e-visit answers were reviewed by a board certified advanced clinical practitioner to complete your personal care plan.  Thank you for using e-Visits.  ===View-only below this line===   ----- Message -----    From: Cynthia Castro    Sent: 11/01/2018 12:58 PM EDT      To: E-Visit Mailing List Subject: E-Visit Submission: Heartburn  E-Visit Submission: Heartburn --------------------------------  Question: How long have you had heartburn? Answer:   A month to several months  Question: How often do you experience heartburn? Answer:   Several times a day  Question: How long does your heartburn last? Answer:   It lasts for several hours  Question: Where do you feel the heartburn? Answer:   In my back            On my left side            In my stomach  Question: Does your heartburn wake you from sleep? Answer:   Yes  Question: Does your heartburn limit your ability to do things you need to do? Answer:   Yes  Question: Does your heartburn change depending on whether you are sitting, standing, or lying down? Answer:   No  Question: Which of the following are you experiencing? Answer:   None of the above  Question: Do you have any  of the following? Answer:   None of the above  Question: Do you have trouble swallowing? Answer:   No  Question: Do you feel full after eating less than usual? Answer:   Yes  Question: Do you have any of the following? Answer:   None of the above  Question: Which of  the following makes the heartburn worse? Answer:   Drinking alcohol            Drinks with caffeine            Eating certain foods            Lying down            Tight clothing            Stress  Question: Do you have any of the following? Answer:   None of the above  Question: Have you lost weight lately without trying? Answer:   Yes  Question: Have you ever been told you have the  following? Answer:   None of the above  Question: Have you seen a healthcare provider for your heartburn? Answer:   I saw someone in the past year  Question: Have you taken any medicines to relieve your heartburn in the past? Answer:   Antacids (Tums, Rolaids, Malox, other)            H2 Blockers (Pepcid, Tagament, Zantac, Axid)            PPIs (Prilosec, Nexium, Prevacid, Prontonix)  Question: Have the medicines provided relief? Answer:   Yes  Question: Have you had any of the following for heartburn? Answer:   Ultrasound  Question: Please list your medication allergies that you may have ? (If 'none' , please list as 'none') Answer:   none known  Question: Please list any additional comments  Answer:   Have had left pain since appointment Nov 18 with Cynthia Castro about sharp pain in left upper quadrant often changing to a blunt type of pressure pain spreading over area,  plus general pressure across mid-torso and more often now gripping pain in back as well. This has been ongoing for the past 4 months-not every minute but most every day. I do not have actual reflux. No blood seen in bm or urine. I can't eat more than a small amount at a time. Even small amounts may not avoid the pain/discomfort.            Abdominal cramping and simultaneous back pain may be different issue. Have scoliosis.            Questionnaire answers not precise. Sharp pain can last for more than an hour, but not always. I sometimes eat standing up because hunched over may cause discomfort. Causing worry to family and anxiety to me . Certainly willing to take a PPI but would like to heal entirely without taking forever. Modifying my eating behavior has helped,but this has stubbornly continued and gradually worse when it happens.  A total of 5-10 minutes was spent evaluating this patients questionnaire and formulating a plan of care.

## 2018-11-08 DIAGNOSIS — E1165 Type 2 diabetes mellitus with hyperglycemia: Secondary | ICD-10-CM | POA: Diagnosis not present

## 2018-11-08 DIAGNOSIS — K859 Acute pancreatitis without necrosis or infection, unspecified: Secondary | ICD-10-CM | POA: Diagnosis not present

## 2018-11-08 DIAGNOSIS — E785 Hyperlipidemia, unspecified: Secondary | ICD-10-CM | POA: Diagnosis not present

## 2018-11-08 DIAGNOSIS — K589 Irritable bowel syndrome without diarrhea: Secondary | ICD-10-CM | POA: Diagnosis not present

## 2018-11-08 DIAGNOSIS — E559 Vitamin D deficiency, unspecified: Secondary | ICD-10-CM | POA: Diagnosis not present

## 2018-11-08 DIAGNOSIS — E039 Hypothyroidism, unspecified: Secondary | ICD-10-CM | POA: Diagnosis not present

## 2018-11-08 DIAGNOSIS — Z0000 Encounter for general adult medical examination without abnormal findings: Secondary | ICD-10-CM | POA: Diagnosis not present

## 2018-11-22 DIAGNOSIS — K859 Acute pancreatitis without necrosis or infection, unspecified: Secondary | ICD-10-CM | POA: Diagnosis not present

## 2018-12-19 ENCOUNTER — Ambulatory Visit: Payer: Medicare Other | Admitting: Family Medicine

## 2019-03-16 ENCOUNTER — Ambulatory Visit: Payer: Medicare Other | Admitting: Obstetrics and Gynecology

## 2019-10-20 ENCOUNTER — Other Ambulatory Visit: Payer: Self-pay | Admitting: Family Medicine

## 2019-10-20 DIAGNOSIS — Z1231 Encounter for screening mammogram for malignant neoplasm of breast: Secondary | ICD-10-CM

## 2019-11-08 ENCOUNTER — Ambulatory Visit: Payer: Medicare Other

## 2019-11-16 ENCOUNTER — Other Ambulatory Visit: Payer: Self-pay

## 2019-11-16 ENCOUNTER — Ambulatory Visit
Admission: RE | Admit: 2019-11-16 | Discharge: 2019-11-16 | Disposition: A | Discharge status: Home / Self Care | Payer: Medicare Other | Source: Ambulatory Visit | Attending: Family Medicine | Admitting: Family Medicine

## 2019-11-16 DIAGNOSIS — Z1231 Encounter for screening mammogram for malignant neoplasm of breast: Secondary | ICD-10-CM

## 2019-11-17 IMAGING — MG DIGITAL SCREENING BILATERAL MAMMOGRAM WITH TOMO AND CAD
8 series · 8 of 24 positions shown · non-contrast
Comparison: Previous exam(s).

CLINICAL DATA: Screening.

EXAM:
DIGITAL SCREENING BILATERAL MAMMOGRAM WITH TOMO AND CAD

[R MLO synth-2D]
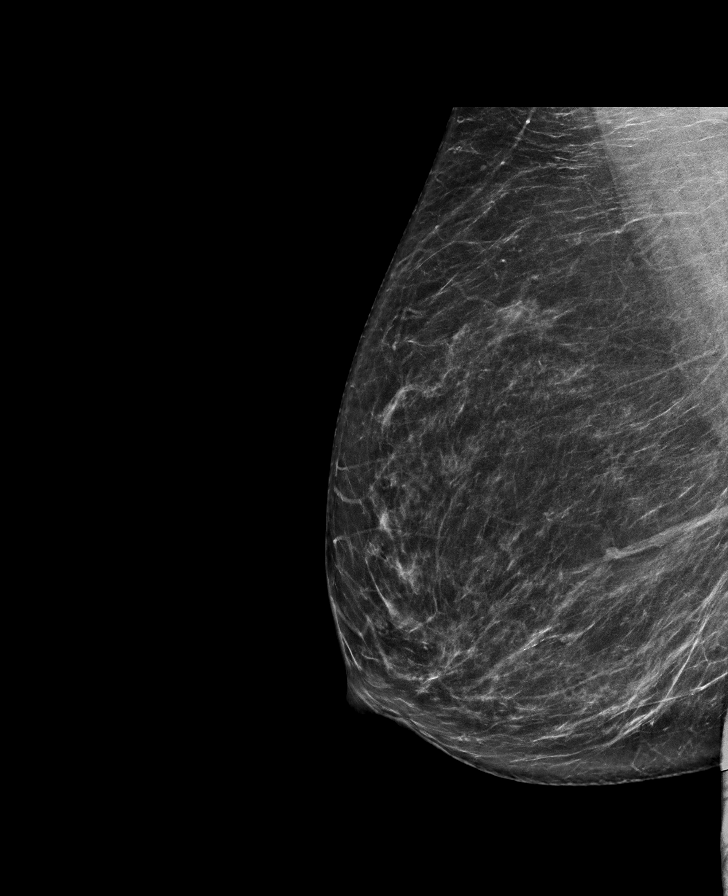

[L CC synth-2D]
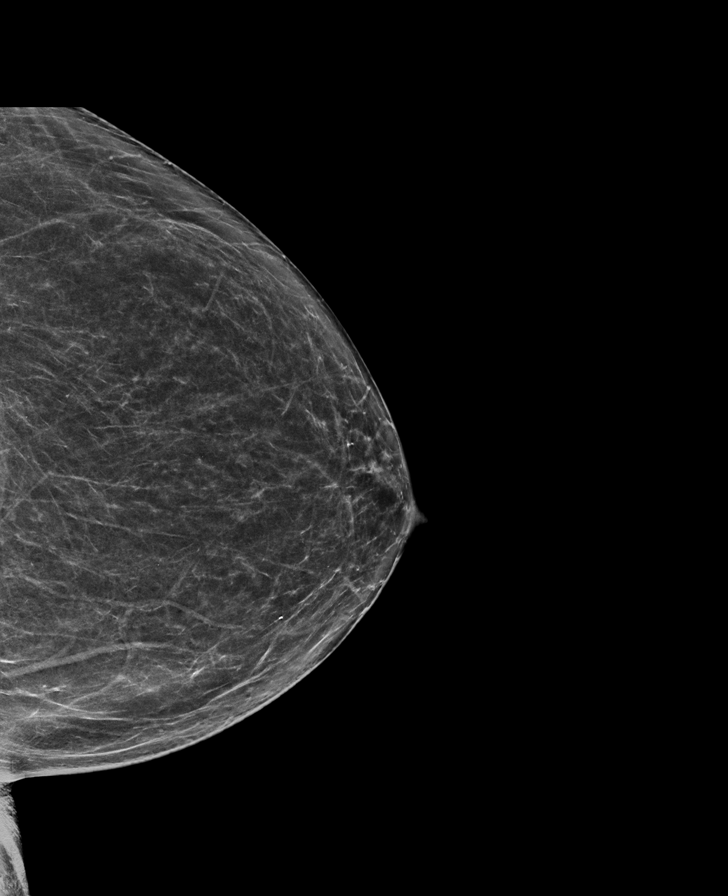

[L MLO synth-2D]
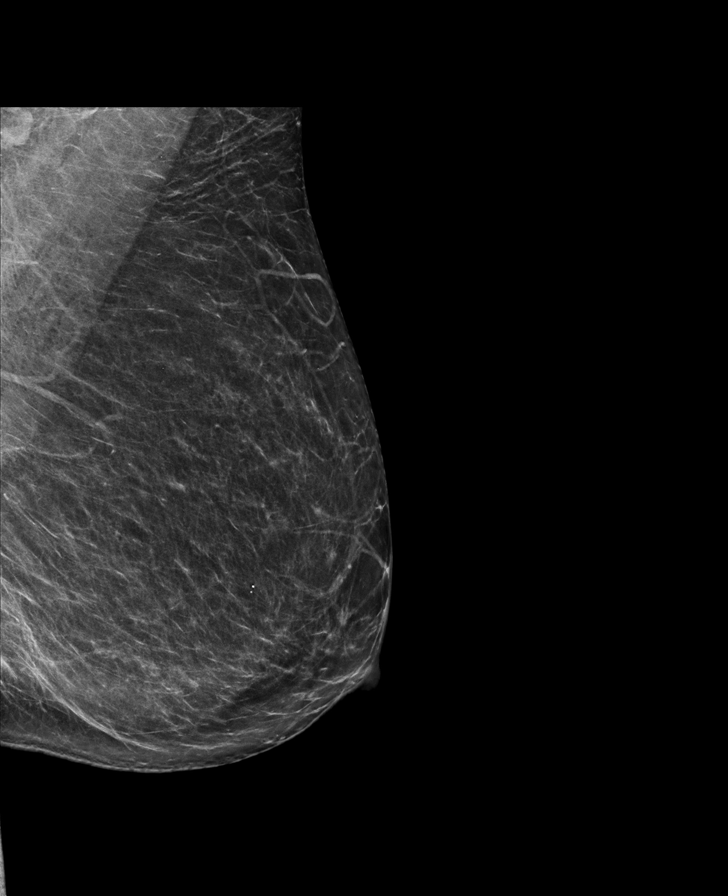

[R CC synth-2D]
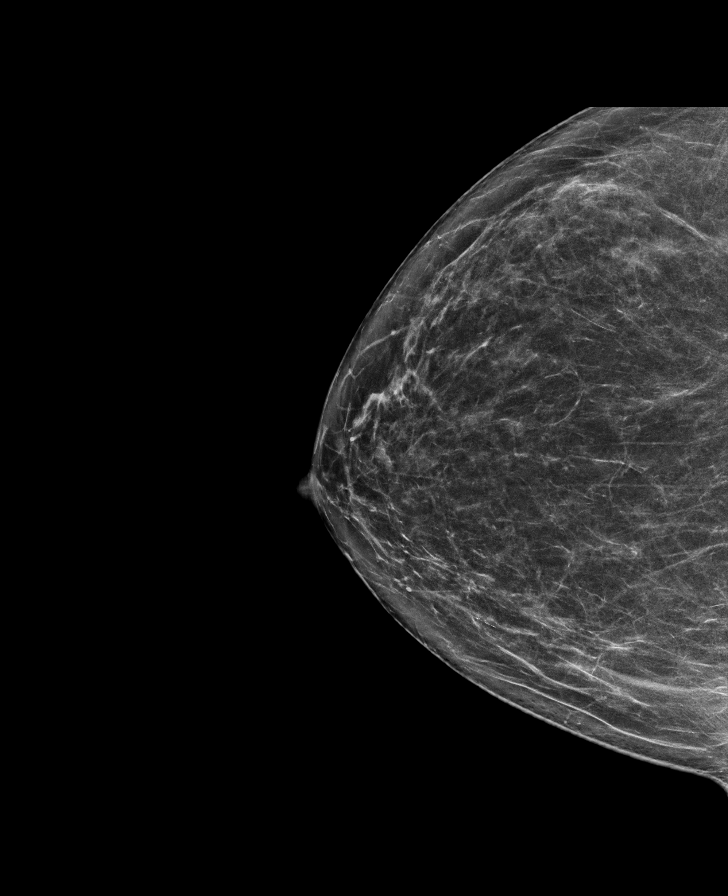

[R CC tomo · tomo slice 36/71.0]
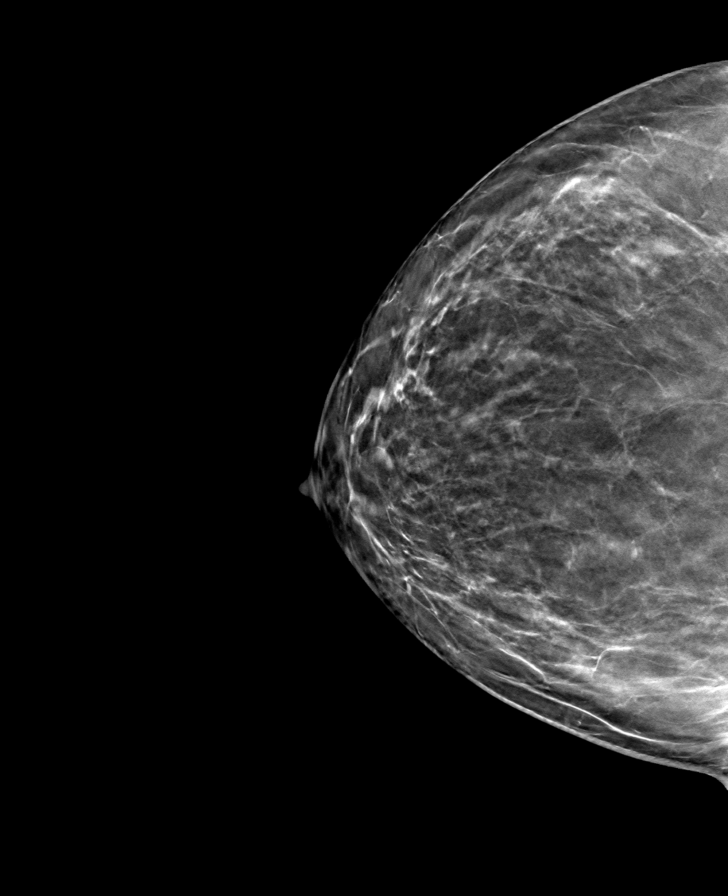

[L MLO tomo · tomo slice 36/71.0]
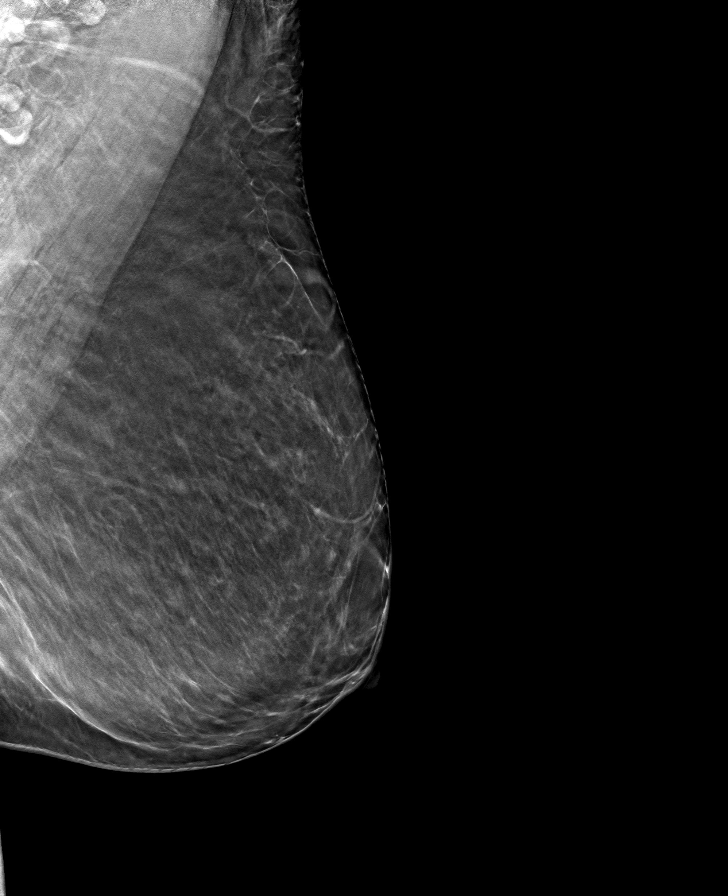

[L CC tomo · tomo slice 34/67.0]
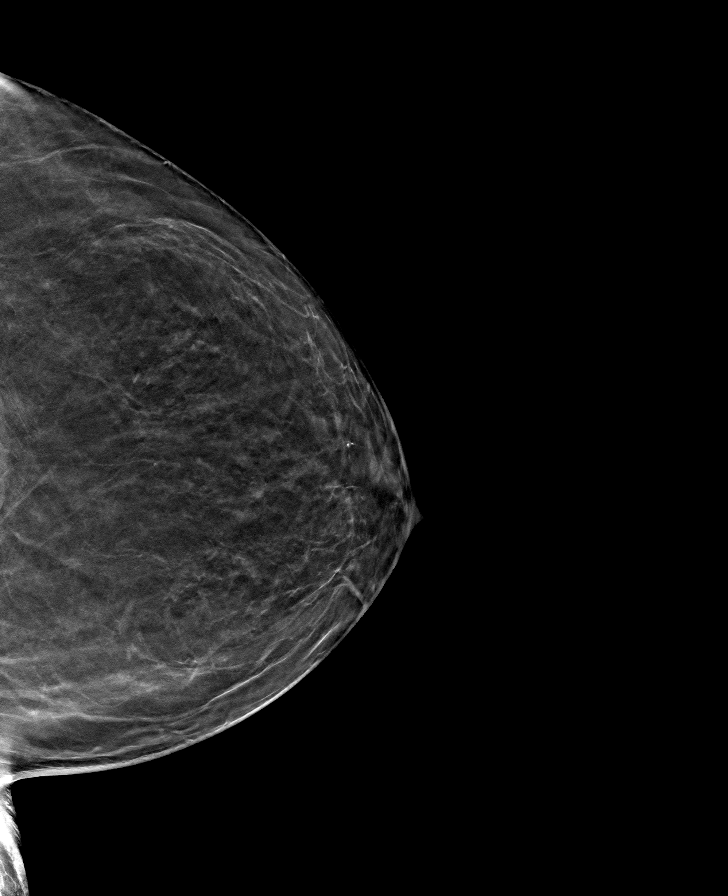

[R MLO tomo · tomo slice 38/75.0]
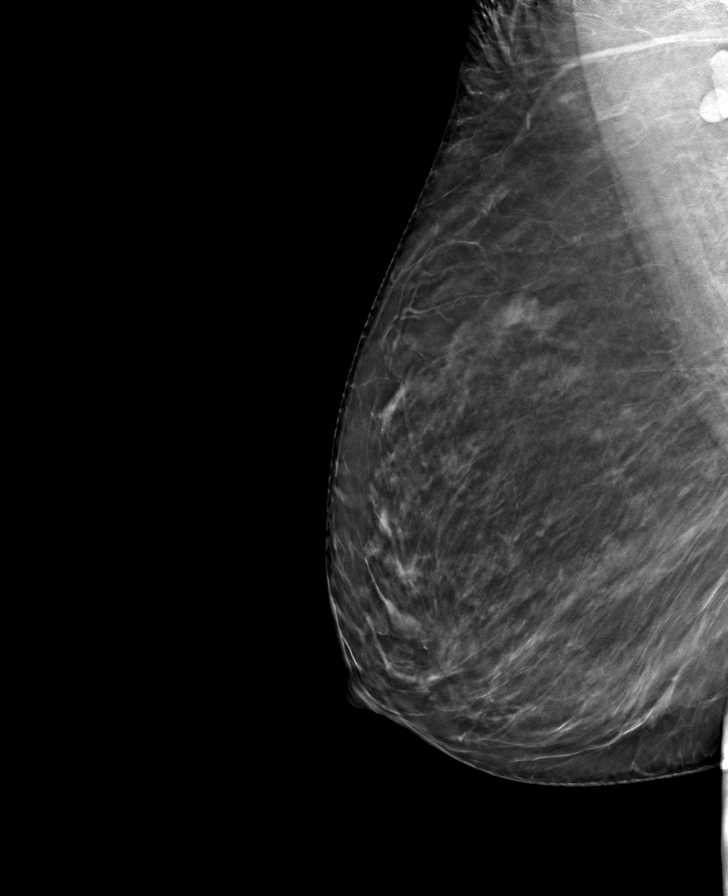

[8 of 24 positions shown; findings below may reference images not displayed]

ACR Breast Density Category b: There are scattered areas of
fibroglandular density.
FINDINGS: There are no findings suspicious for malignancy. Images were
processed with CAD.
IMPRESSION: No mammographic evidence of malignancy. A result letter of this
screening mammogram will be mailed directly to the patient.

RECOMMENDATION:
Screening mammogram in one year. (Code:[id number])

BI-RADS CATEGORY  1: Negative.

## 2019-12-02 LAB — COLOGUARD: COLOGUARD: NEGATIVE

## 2020-03-27 ENCOUNTER — Telehealth: Payer: Self-pay

## 2020-03-27 NOTE — Telephone Encounter (Signed)
REFERRAL ONLY FROM [facility] [phone] REFERRAL TO [place]

## 2020-04-01 ENCOUNTER — Other Ambulatory Visit: Payer: Self-pay | Admitting: Family Medicine

## 2020-04-01 ENCOUNTER — Ambulatory Visit
Admission: RE | Admit: 2020-04-01 | Discharge: 2020-04-01 | Disposition: A | Discharge status: Home / Self Care | Payer: Medicare Other | Source: Ambulatory Visit | Attending: Family Medicine | Admitting: Family Medicine

## 2020-04-01 DIAGNOSIS — M545 Low back pain, unspecified: Secondary | ICD-10-CM

## 2020-04-01 DIAGNOSIS — M419 Scoliosis, unspecified: Secondary | ICD-10-CM

## 2020-04-23 ENCOUNTER — Other Ambulatory Visit: Payer: Self-pay | Admitting: *Deleted

## 2020-04-23 DIAGNOSIS — R001 Bradycardia, unspecified: Secondary | ICD-10-CM

## 2020-04-24 ENCOUNTER — Telehealth: Payer: Self-pay | Admitting: *Deleted

## 2020-04-24 NOTE — Telephone Encounter (Signed)
Patient enrolled for [employer] to ship a 3 day ZIO XT long term holter monitor to her home 05/02/2020.  Instructions reviewed briefly as they are included in the monitor kit.

## 2020-04-30 ENCOUNTER — Ambulatory Visit (INDEPENDENT_AMBULATORY_CARE_PROVIDER_SITE_OTHER): Payer: Medicare Other

## 2020-04-30 DIAGNOSIS — R001 Bradycardia, unspecified: Secondary | ICD-10-CM | POA: Diagnosis not present

## 2020-09-10 ENCOUNTER — Ambulatory Visit: Payer: Medicare Other | Admitting: Dermatology

## 2020-09-10 ENCOUNTER — Other Ambulatory Visit: Payer: Self-pay

## 2020-09-10 ENCOUNTER — Encounter: Payer: Self-pay | Admitting: Dermatology

## 2020-09-10 DIAGNOSIS — Z1283 Encounter for screening for malignant neoplasm of skin: Secondary | ICD-10-CM | POA: Diagnosis not present

## 2020-09-10 DIAGNOSIS — D1801 Hemangioma of skin and subcutaneous tissue: Secondary | ICD-10-CM | POA: Diagnosis not present

## 2020-09-10 DIAGNOSIS — L821 Other seborrheic keratosis: Secondary | ICD-10-CM | POA: Diagnosis not present

## 2020-09-10 DIAGNOSIS — Z85828 Personal history of other malignant neoplasm of skin: Secondary | ICD-10-CM

## 2020-09-20 ENCOUNTER — Encounter: Payer: Self-pay | Admitting: Dermatology

## 2020-09-20 NOTE — Progress Notes (Signed)
   New patient visit   Subjective  Cynthia Castro is a 80 y.o. female who presents for the following: New Patient (Initial Visit) (Patient here today for a yearly skin check. No concerns.).  General skin examination Location:  Duration:  Quality:  Associated Signs/Symptoms: Modifying Factors:  Severity:  Timing: Context:   Objective  Well appearing patient in no apparent distress; mood and affect are within normal limits. Objective  Right Nostril: No sign recurrence  Objective  Head to toe: General skin examination, no atypical moles or melanoma or new nonmole skin cancer  Objective  Mid Forehead: 4 mm red-tan flattopped textured papule  Objective  Chest - Medial [facility]): Multiple smooth 2 mm red papules   A full examination was performed including scalp, head, eyes, ears, nose, lips, neck, chest, axillae, abdomen, back, buttocks, bilateral upper extremities, bilateral lower extremities, hands, feet, fingers, toes, fingernails, and toenails. All findings within normal limits unless otherwise noted below.   Assessment & Plan    History of basal cell carcinoma (BCC) Right Nostril  Annual skin examination  Skin exam for malignant neoplasm Head to toe  Yearly skin check  Seborrheic keratosis Mid Forehead  May leave if stable  Cherry angioma Chest - Medial [facility])  No intervention necessary     I, Lavonna Monarch, MD, have reviewed all documentation for this visit.  The documentation on 09/20/20 for the exam, diagnosis, procedures, and orders are all accurate and complete.

## 2021-08-29 ENCOUNTER — Other Ambulatory Visit: Payer: Self-pay | Admitting: Gastroenterology

## 2021-08-29 DIAGNOSIS — K929 Disease of digestive system, unspecified: Secondary | ICD-10-CM

## 2021-08-29 DIAGNOSIS — R1013 Epigastric pain: Secondary | ICD-10-CM

## 2021-09-04 ENCOUNTER — Other Ambulatory Visit: Payer: Medicare Other

## 2021-09-09 ENCOUNTER — Other Ambulatory Visit: Payer: Self-pay

## 2021-09-09 ENCOUNTER — Ambulatory Visit
Admission: RE | Admit: 2021-09-09 | Discharge: 2021-09-09 | Disposition: A | Discharge status: Home / Self Care | Payer: Medicare Other | Source: Ambulatory Visit | Attending: Gastroenterology | Admitting: Gastroenterology

## 2021-09-09 ENCOUNTER — Other Ambulatory Visit: Payer: Self-pay | Admitting: Gastroenterology

## 2021-09-09 DIAGNOSIS — K929 Disease of digestive system, unspecified: Secondary | ICD-10-CM

## 2021-09-09 DIAGNOSIS — R1013 Epigastric pain: Secondary | ICD-10-CM

## 2022-02-27 ENCOUNTER — Other Ambulatory Visit: Payer: Self-pay | Admitting: Family Medicine

## 2022-02-27 DIAGNOSIS — Z1231 Encounter for screening mammogram for malignant neoplasm of breast: Secondary | ICD-10-CM

## 2022-03-18 ENCOUNTER — Ambulatory Visit
Admission: RE | Admit: 2022-03-18 | Discharge: 2022-03-18 | Disposition: A | Discharge status: Home / Self Care | Payer: Medicare Other | Source: Ambulatory Visit | Attending: Family Medicine | Admitting: Family Medicine

## 2022-03-18 DIAGNOSIS — Z1231 Encounter for screening mammogram for malignant neoplasm of breast: Secondary | ICD-10-CM

## 2022-04-10 ENCOUNTER — Other Ambulatory Visit: Payer: Self-pay | Admitting: Family Medicine

## 2022-04-10 DIAGNOSIS — M810 Age-related osteoporosis without current pathological fracture: Secondary | ICD-10-CM

## 2022-06-13 ENCOUNTER — Emergency Department (HOSPITAL_COMMUNITY)
Admission: EM | Admit: 2022-06-13 | Discharge: 2022-06-13 | Discharge status: Against Medical Advice | Payer: Medicare Other | Attending: Emergency Medicine | Admitting: Emergency Medicine

## 2022-06-13 ENCOUNTER — Emergency Department (HOSPITAL_COMMUNITY): Payer: Medicare Other

## 2022-06-13 ENCOUNTER — Encounter (HOSPITAL_COMMUNITY): Payer: Self-pay | Admitting: *Deleted

## 2022-06-13 ENCOUNTER — Encounter (HOSPITAL_COMMUNITY): Payer: Self-pay | Admitting: Emergency Medicine

## 2022-06-13 ENCOUNTER — Other Ambulatory Visit: Payer: Self-pay

## 2022-06-13 ENCOUNTER — Ambulatory Visit (HOSPITAL_COMMUNITY)
Admission: EM | Admit: 2022-06-13 | Discharge: 2022-06-13 | Disposition: A | Discharge status: Home / Self Care | Payer: Medicare Other

## 2022-06-13 DIAGNOSIS — Z5321 Procedure and treatment not carried out due to patient leaving prior to being seen by health care provider: Secondary | ICD-10-CM | POA: Diagnosis not present

## 2022-06-13 DIAGNOSIS — I459 Conduction disorder, unspecified: Secondary | ICD-10-CM | POA: Diagnosis not present

## 2022-06-13 DIAGNOSIS — R002 Palpitations: Secondary | ICD-10-CM | POA: Insufficient documentation

## 2022-06-13 LAB — BASIC METABOLIC PANEL
Anion gap: 7 (ref 5–15)
BUN: 17 mg/dL (ref 8–23)
CO2: 23 mmol/L (ref 22–32)
Calcium: 9 mg/dL (ref 8.9–10.3)
Chloride: 109 mmol/L (ref 98–111)
Creatinine, Ser: 0.75 mg/dL (ref 0.44–1.00)
GFR, Estimated: >60 mL/min (ref >60)
Glucose, Bld: 97 mg/dL (ref 70–99)
Potassium: 4 mmol/L (ref 3.5–5.1)
Sodium: 139 mmol/L (ref 135–145)

## 2022-06-13 LAB — CBC
HCT: 46.7 % — ABNORMAL HIGH (ref 36.0–46.0)
Hemoglobin: 14.8 g/dL (ref 12.0–15.0)
MCH: 29 pg (ref 26.0–34.0)
MCHC: 31.7 g/dL (ref 30.0–36.0)
MCV: 91.4 fL (ref 80.0–100.0)
Platelets: 271 10*3/uL (ref 150–400)
RBC: 5.11 MIL/uL (ref 3.87–5.11)
RDW: 13.5 % (ref 11.5–15.5)
WBC: 7.3 10*3/uL (ref 4.0–10.5)
nRBC: 0 % (ref 0.0–0.2)

## 2022-06-13 NOTE — ED Triage Notes (Signed)
Pt was in ED but left due to wait times. She states that she has a pulse ox at home and her HR was normal on it but the little line was going all over the place so she was worried.

## 2022-06-13 NOTE — ED Provider Notes (Signed)
[facility]    CSN: [id number] Arrival date & time: 06/13/22  1002      History   Chief Complaint Chief Complaint  Patient presents with   Irregular Heart Beat    HPI Cynthia Castro is a 81 y.o. female.   Patient presents urgent care for evaluation of feeling skipped beats in her heart rate when she was palpating her carotid pulse this morning upon waking.  She denies heart palpitations, chest pain, shortness of breath, fever or chills, history of thyroid problems, or recent medication changes.  She has never had a history of atrial fibrillation or SVT.  States that this sensation has happened in the past when she was at a yoga class and she went to get an EKG afterwards where she was told that she had some PVCs to her EKG.  She checked her heart rate and her oxygen levels on a portable pulse oximeter this morning and noticed that the line at the bottom below the numbers was very irregular, but the numbers were normal.  Patient became concerned due to the irregular line (pleth).  Patient went to the ER for evaluation this morning where she received a normal work-up but left without being seen due to the wait time.  She is currently asymptomatic and denies associated dizziness, nausea, vomiting, dehydration, urinary symptoms, fever/chills, diarrhea, blood/mucus to the stools, decreased appetite, and vision changes.     Past Medical History:  Diagnosis Date   Arthritis    Basal cell carcinoma 02/25/2016   BCC NOD. [place]    DDD (degenerative disc disease), cervical    History of kidney stones 05/2013   Hyperlipidemia    Reflux     Patient Active Problem List   Diagnosis Date Noted   Arthralgia of multiple joints 05/11/2017   GERD (gastroesophageal reflux disease) 07/11/2015   DDD (degenerative disc disease), lumbar 11/24/2013   Difficulty urinating 05/09/2013   Rectocele 12/22/2012   Diverticulosis of sigmoid colon 08/24/2012   Seborrheic keratosis 06/27/2012    Hypercholesteremia 04/21/2012   Scoliosis of lumbar spine 04/21/2012   Overweight (BMI 25.0-29.9) 04/21/2012    Past Surgical History:  Procedure Laterality Date   CESAREAN SECTION  1984   CHOLECYSTECTOMY  2005   CYSTECTOMY  2003   hysteroscopic resection     hysteroscopic resection of polyp   LITHOTRIPSY  04/2013   --Dr. Janice Norrie   UTERINE CYST REMOVAL  2005    OB History     Gravida  2   Para  2   Term  1   Preterm  1   AB  0   Living  3      SAB  0   IAB  0   Ectopic  0   Multiple  1   Live Births  3            Home Medications    Prior to Admission medications   Medication Sig Start Date End Date Taking? Authorizing Provider  diclofenac (VOLTAREN) 75 MG EC tablet TAKE 1 TABLET(75 MG) BY MOUTH TWICE DAILY AS NEEDED FOR JOINT PAIN 05/30/18  Yes Shawnee Knapp, MD  diphenhydrAMINE (BENADRYL) 25 MG tablet Take 25 mg at bedtime as needed by mouth for sleep.   Yes [provider]  loratadine (CLARITIN) 10 MG tablet Take 1 tablet (10 mg total) by mouth daily. 05/10/18  Yes Shawnee Knapp, MD  Metoclopramide HCl (REGLAN PO) Take 1 tablet by mouth  as needed.   Yes [provider]  gabapentin (NEURONTIN) 100 MG capsule Take 100 mg by mouth 3 (three) times daily. Patient not taking: Reported on 09/10/2020 03/25/20   [provider]  MELATONIN PO Take by mouth as needed. Patient not taking: Reported on 09/10/2020    [provider]    Family History Family History  Problem Relation Age of Onset   Diabetes Mother    Heart disease Mother        CHF   Vision loss Mother    Cancer Father        TESTICULAR   Stroke Father 20   Hyperlipidemia Father    Hyperlipidemia Maternal Grandmother    Diabetes Maternal Grandmother    Heart attack Maternal Grandfather    Cancer Paternal Grandmother    Breast cancer Paternal Grandmother    Cancer Paternal Grandfather     Social History Social History   Tobacco Use   Smoking status:  Never   Smokeless tobacco: Never  Vaping Use   Vaping Use: Never used  Substance Use Topics   Alcohol use: Yes    Comment: WINE OR BEER - 1 OR 2/month   Drug use: No     Allergies   Patient has no known allergies.   Review of Systems Review of Systems Per HPI  Physical Exam Triage Vital Signs ED Triage Vitals  Enc Vitals Group     BP 06/13/22 1018 (!) 166/80     Pulse Rate 06/13/22 1018 90     Resp 06/13/22 1018 18     Temp 06/13/22 1018 98.1 F (36.7 C)     Temp Source 06/13/22 1018 Oral     SpO2 06/13/22 1018 94 %     Weight --      Height --      Head Circumference --      Peak Flow --      Pain Score 06/13/22 1015 0     Pain Loc --      Pain Edu? --      Excl. in [place]? --    No data found.  Updated Vital Signs BP (!) 166/80 (BP Location: Left Arm)   Pulse 90   Temp 98.1 F (36.7 C) (Oral)   Resp 18   LMP 08/03/1998   SpO2 94%   Visual Acuity Right Eye Distance:   Left Eye Distance:   Bilateral Distance:    Right Eye Near:   Left Eye Near:    Bilateral Near:     Physical Exam Vitals and nursing note reviewed.  Constitutional:      Appearance: She is not ill-appearing or toxic-appearing.  HENT:     Head: Normocephalic and atraumatic.     Right Ear: Hearing and external ear normal.     Left Ear: Hearing and external ear normal.     Nose: Nose normal.     Mouth/Throat:     Lips: Pink.  Eyes:     General: Lids are normal. Vision grossly intact. Gaze aligned appropriately.     Extraocular Movements: Extraocular movements intact.     Conjunctiva/sclera: Conjunctivae normal.  Cardiovascular:     Rate and Rhythm: Normal rate and regular rhythm.     Heart sounds: Normal heart sounds, S1 normal and S2 normal.  Pulmonary:     Effort: Pulmonary effort is normal. No respiratory distress.     Breath sounds: Normal breath sounds and air entry.  Musculoskeletal:  Cervical back: Neck supple.  Skin:    General: Skin is warm and dry.     Capillary  Refill: Capillary refill takes less than 2 seconds.     Findings: No rash.  Neurological:     General: No focal deficit present.     Mental Status: She is alert and oriented to person, place, and time. Mental status is at baseline.     Cranial Nerves: No dysarthria or facial asymmetry.  Psychiatric:        Mood and Affect: Mood normal.        Speech: Speech normal.        Behavior: Behavior normal.        Thought Content: Thought content normal.        Judgment: Judgment normal.      UC Treatments / Results  Labs (all labs ordered are listed, but only abnormal results are displayed) Labs Reviewed - No data to display  EKG   Radiology DG Chest 2 View  Result Date: 06/13/2022 CLINICAL DATA:  81 year old female with palpitations since 0430 hours. EXAM: CHEST - 2 VIEW COMPARISON:  Chest radiographs 11/01/2014 and earlier. FINDINGS: PA and lateral views at 0716 hours. Lung volumes and mediastinal contours are stable since 2016 and within normal limits. No convincing cardiomegaly. Visualized tracheal air column is within normal limits. Mild EKG button artifact in both lungs which appear clear. No pneumothorax or pleural effusion. Lumbar scoliosis. No acute osseous abnormality identified. Negative visible bowel gas. IMPRESSION: Negative for age.  No acute cardiopulmonary abnormality. Electronically Signed   By: Genevie Ann M.D.   On: 06/13/2022 07:40    Procedures Procedures (including critical care time)  Medications Ordered in UC Medications - No data to display  Initial Impression / Assessment and Plan / UC Course  I have reviewed the triage vital signs and the nursing notes.  Pertinent labs & imaging results that were available during my care of the patient were reviewed by me and considered in my medical decision making (see chart for details).   1.  Skipped heartbeats Patient's ER work-up this morning is without abnormality.  EKG shows normal sinus rhythm without ST changes, BMP  and CBC reviewed and are normal.  Chest x-ray is also without cardiopulmonary abnormality.  Lung sounds and heart sounds to auscultation here in the clinic are without cardiopulmonary abnormality as well.  Patient is nontoxic in appearance with stable vital signs.  Advised patient to warm her hands prior to using her home pulse oximeter as this may help the device work better.  Advised patient to maintain adequate water intake to prevent dehydration as this can also cause skipped heartbeat feeling.  ER and urgent care return precautions discussed.   Discussed physical exam and available lab work findings in clinic with patient.  Counseled patient regarding appropriate use of medications and potential side effects for all medications recommended or prescribed today. Discussed red flag signs and symptoms of worsening condition,when to call the PCP office, return to urgent care, and when to seek higher level of care in the emergency department. Patient verbalizes understanding and agreement with plan. All questions answered. Patient discharged in stable condition.    Final Clinical Impressions(s) / UC Diagnoses   Final diagnoses:  Skipped heart beats   Discharge Instructions   None    ED Prescriptions   None    PDMP not reviewed this encounter.   Talbot Grumbling, [place] 06/13/22 1052

## 2022-06-13 NOTE — ED Triage Notes (Signed)
Pt states that she feels like her pulse was "missing a beat." Pt denies chest pain, shob, dizziness

## 2022-10-02 ENCOUNTER — Ambulatory Visit
Admission: RE | Admit: 2022-10-02 | Discharge: 2022-10-02 | Disposition: A | Discharge status: Home / Self Care | Payer: Medicare Other | Source: Ambulatory Visit | Attending: Family Medicine | Admitting: Family Medicine

## 2022-10-02 DIAGNOSIS — M810 Age-related osteoporosis without current pathological fracture: Secondary | ICD-10-CM

## 2022-12-14 ENCOUNTER — Ambulatory Visit (INDEPENDENT_AMBULATORY_CARE_PROVIDER_SITE_OTHER): Payer: Medicare Other | Admitting: Physical Medicine and Rehabilitation

## 2022-12-14 ENCOUNTER — Encounter: Payer: Self-pay | Admitting: Physical Medicine and Rehabilitation

## 2022-12-14 DIAGNOSIS — M419 Scoliosis, unspecified: Secondary | ICD-10-CM

## 2022-12-14 DIAGNOSIS — M5442 Lumbago with sciatica, left side: Secondary | ICD-10-CM | POA: Diagnosis not present

## 2022-12-14 DIAGNOSIS — G8929 Other chronic pain: Secondary | ICD-10-CM | POA: Diagnosis not present

## 2022-12-14 DIAGNOSIS — M5416 Radiculopathy, lumbar region: Secondary | ICD-10-CM

## 2022-12-14 MED ORDER — DIAZEPAM 5 MG PO TABS: ORAL_TABLET | ORAL | 0 refills | Status: AC

## 2022-12-14 NOTE — Progress Notes (Unsigned)
Cynthia Castro - 82 y.o. female MRN [id number]  Date of birth: 1941-06-14  Office Visit Note: Visit Date: 12/14/2022 PCP: Dois Davenport, MD Referred by: Dois Davenport, MD  Subjective: Chief Complaint  Patient presents with   Lower Back - Pain   HPI: Cynthia Castro is a 82 y.o. female who comes in today as a self referral for evaluation of chronic, worsening and severe left sided lower back pain radiating to buttock. Reports stiffness to left anterior thigh. Pain ongoing intermittently for over a year. States her pain comes and goes regardless of activity. She describes pain as sore and aching, currently rates as 2 out of 10. Some relief of pain with home exercise regimen, rest and use of medications. Does take oral Voltaren intermittently for joint pain. History of formal physical therapy with [facility], she reports some relief of pain with these treatments. Lumbar spine x-rays from 2021 exhibits unchanged moderate dextrocurvature centered at L3, there is new /progressed retrolisthesis of L5 on S1. No history of lumbar surgery/injections. Also reports onset of lower back pain also causes pressure sensation to rectum. Concerned spine issues are contributing to soft stools. She denies urinary incontinence. No history of evaluation by gastroenterology.  States she is going out of town in the coming weeks and will need to start treatment when she comes back from trip. Patient denies focal weakness, numbness and tingling. No recent trauma or falls.   Oswestry Disability Index Score 34% 10 to 20 (40%) moderate disability: The patient experiences more pain and difficulty with sitting, lifting and standing. Travel and social life are more difficult, and they may be disabled from work. Personal care, sexual activity and sleeping are not grossly affected, and the patient can usually be managed by conservative means.  Review of Systems  Genitourinary:        She associated pressure to rectum with lower  back pain, also reports soft stools.   Musculoskeletal:  Positive for back pain.  Neurological:  Negative for tingling, sensory change, focal weakness and weakness.  All other systems reviewed and are negative.  Otherwise per HPI.  Assessment & Plan: Visit Diagnoses:    ICD-10-CM   1. Chronic bilateral low back pain with left-sided sciatica  M54.42 MR LUMBAR SPINE WO CONTRAST   G89.29 Ambulatory referral to Physical Therapy    2. Lumbar radiculopathy  M54.16 MR LUMBAR SPINE WO CONTRAST    Ambulatory referral to Physical Therapy    3. Scoliosis of lumbar spine, unspecified scoliosis type  M41.9 MR LUMBAR SPINE WO CONTRAST    Ambulatory referral to Physical Therapy       Plan: Findings:  Chronic, worsening and severe left sided lower back pain radiating to buttock and anterior thigh. Patient continues to have severe pain despite good conservative therapies such as formal physical therapy, home exercise regimen, rest and use of medications. Patients clinical presentation and exam are complex, differentials include lumbar radiculopathy vs facet joint syndrome. Her exam today is non focal, good strength noted to bilateral lower extremities. Given her symptoms and bowel issues next step is to obtain new lumbar MRI imaging. Patient did voice concerns with anxiety related to MRI, I did prescribe prescription for pre-procedure Valium for her to take on day of imaging. Depending on lumbar MRI imaging we discussed possibility of performing lumbar epidural steroid injection. I also placed order for formal physical therapy as these treatments seemed to be beneficial for her in the past. Patient is going  out of town for trip, she will plan to have MRI and start PT the beginning of June. I will have patient follow up with [place] for lumbar MRI review and to discuss treatment options. No red flag symptoms noted upon exam today.     Meds & Orders:  Meds ordered this encounter  Medications   diazepam (VALIUM) 5  MG tablet    Sig: Take one tablet by mouth with food one hour prior to procedure. May repeat 30 minutes prior if needed.    Dispense:  2 tablet    Refill:  0    Orders Placed This Encounter  Procedures   MR LUMBAR SPINE WO CONTRAST   Ambulatory referral to Physical Therapy    Follow-up: Return for follow up for lumbar MRI review.   Procedures: No procedures performed      Clinical History: Narrative & Impression CLINICAL DATA:  Low back pain   EXAM: LUMBAR SPINE - COMPLETE 4+ VIEW   COMPARISON:  Lumbar spine radiographs dated 04/19/2012.   FINDINGS: Moderate dextrocurvature of the lumbar spine centered at L3 is not significantly changed since 04/19/2012. There is approximately 10 mm retrolisthesis of L5 on S1 which appears new/progressed since 04/19/2012. Multilevel degenerative disc and joint disease is noted. There is osseous demineralization.   IMPRESSION: 1. New/progressed retrolisthesis of L5 on S1. 2. 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     Electronically Signed   By: Romona Curls M.D.   On: 04/02/2020 15:18   She reports that she has never smoked. She has never used smokeless tobacco. No results for input(s): "HGBA1C", "LABURIC" in the last 8760 hours.  Objective:  VS:  HT:    WT:   BMI:     BP:   HR: bpm  TEMP: ( )  RESP:  Physical Exam Vitals and nursing note reviewed.  HENT:     Head: Normocephalic and atraumatic.     Right Ear: External ear normal.     Left Ear: External ear normal.     Nose: Nose normal.     Mouth/Throat:     Mouth: Mucous membranes are moist.  Eyes:     Extraocular Movements: Extraocular movements intact.  Cardiovascular:     Rate and Rhythm: Normal rate.     Pulses: Normal pulses.  Pulmonary:     Effort: Pulmonary effort is normal.  Abdominal:     General: Abdomen is flat. There is no distension.  Musculoskeletal:        General: Tenderness present.     Cervical back: Normal range  of motion.     Comments: Patient rises from seated position to standing without difficulty. Good lumbar range of motion. No pain noted with facet loading. 5/5 strength noted with bilateral hip flexion, knee flexion/extension, ankle dorsiflexion/plantarflexion and EHL. No clonus noted bilaterally. No pain upon palpation of greater trochanters. No pain with internal/external rotation of bilateral hips. Sensation intact bilaterally. Negative slump test bilaterally. Ambulates without aid, gait steady.     Skin:    General: Skin is warm and dry.     Capillary Refill: Capillary refill takes less than 2 seconds.  Neurological:     General: No focal deficit present.     Mental Status: She is alert and oriented to person, place, and time.  Psychiatric:        Mood and Affect: Mood normal.        Behavior: Behavior normal.     Ortho  Exam  Imaging: No results found.  Past Medical/Family/Surgical/Social History: Medications & Allergies reviewed per EMR, new medications updated. Patient Active Problem List   Diagnosis Date Noted   Arthralgia of multiple joints 05/11/2017   GERD (gastroesophageal reflux disease) 07/11/2015   DDD (degenerative disc disease), lumbar 11/24/2013   Difficulty urinating 05/09/2013   Rectocele 12/22/2012   Diverticulosis of sigmoid colon 08/24/2012   Seborrheic keratosis 06/27/2012   Hypercholesteremia 04/21/2012   Scoliosis of lumbar spine 04/21/2012   Overweight (BMI 25.0-29.9) 04/21/2012   Past Medical History:  Diagnosis Date   Arthritis    Basal cell carcinoma 02/25/2016   BCC NOD. TX CX3 & EXC    DDD (degenerative disc disease), cervical    History of kidney stones 05/2013   Hyperlipidemia    Reflux    Family History  Problem Relation Age of Onset   Diabetes Mother    Heart disease Mother        CHF   Vision loss Mother    Cancer Father        TESTICULAR   Stroke Father 9   Hyperlipidemia Father    Hyperlipidemia Maternal Grandmother     Diabetes Maternal Grandmother    Heart attack Maternal Grandfather    Cancer Paternal Grandmother    Breast cancer Paternal Grandmother    Cancer Paternal Grandfather    Past Surgical History:  Procedure Laterality Date   CESAREAN SECTION  1984   CHOLECYSTECTOMY  2005   CYSTECTOMY  2003   hysteroscopic resection     hysteroscopic resection of polyp   LITHOTRIPSY  04/2013   --Dr. 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

## 2022-12-14 NOTE — Progress Notes (Unsigned)
Functional Pain Scale - descriptive words and definitions  Uncomfortable (3)  Pain is present but can complete all ADL's/sleep is slightly affected and passive distraction only gives marginal relief. Mild range order  Average Pain 2-3  Lower back pain on left side close to the center with no radiation

## 2022-12-15 ENCOUNTER — Ambulatory Visit: Payer: Medicare Other | Admitting: Physical Therapy

## 2022-12-15 ENCOUNTER — Other Ambulatory Visit: Payer: Self-pay

## 2022-12-15 ENCOUNTER — Encounter: Payer: Self-pay | Admitting: Physical Therapy

## 2022-12-15 DIAGNOSIS — R262 Difficulty in walking, not elsewhere classified: Secondary | ICD-10-CM

## 2022-12-15 DIAGNOSIS — M25552 Pain in left hip: Secondary | ICD-10-CM

## 2022-12-15 DIAGNOSIS — M5459 Other low back pain: Secondary | ICD-10-CM | POA: Diagnosis not present

## 2022-12-15 DIAGNOSIS — M6281 Muscle weakness (generalized): Secondary | ICD-10-CM | POA: Diagnosis not present

## 2022-12-15 NOTE — Therapy (Addendum)
OUTPATIENT PHYSICAL THERAPY THORACOLUMBAR EVALUATION / DISCHARGE   Patient Name: Cynthia Castro MRN: [id number] DOB:Nov 01, 1940, 82 y.o., female Today's Date: 12/15/2022  END OF SESSION:  PT End of Session - 12/15/22 1607     Visit Number 1    Number of Visits 8    Date for PT Re-Evaluation 03/09/23    Authorization Type [employer] MCR    PT Start Time 1515    PT Stop Time 1602    PT Time Calculation (min) 47 min    Activity Tolerance Patient tolerated treatment well    Behavior During Therapy [facility] for tasks assessed/performed             Past Medical History:  Diagnosis Date   Arthritis    Basal cell carcinoma 02/25/2016   BCC NOD. TX CX3 & EXC    DDD (degenerative disc disease), cervical    History of kidney stones 05/2013   Hyperlipidemia    Reflux    Past Surgical History:  Procedure Laterality Date   CESAREAN SECTION  1984   CHOLECYSTECTOMY  2005   CYSTECTOMY  2003   hysteroscopic resection     hysteroscopic resection of polyp   LITHOTRIPSY  04/2013   --Dr. 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/21/2012   Overweight (BMI 25.0-29.9) 04/21/2012    PCP: Dois Davenport, MD   REFERRING PROVIDER: Juanda Chance, NP  REFERRING DIAG: M54.16 (ICD-10-CM) - Lumbar radiculopathy M41.9 (ICD-10-CM) - Scoliosis of lumbar spine, unspecified scoliosis type M54.42,G89.29 (ICD-10-CM) - Chronic bilateral low back pain with left-sided sciatica  Rationale for Evaluation and Treatment: Rehabilitation  THERAPY DIAG:  Other low back pain  Pain in left hip  Muscle weakness  (generalized)  Difficulty in walking, not elsewhere classified  ONSET DATE: chronic pain > one year  SUBJECTIVE:                                                                                                                                                                                           SUBJECTIVE STATEMENT: chronic, worsening and severe left sided lower back pain radiating to around SIJ. Pain ongoing intermittently for over a year and sometimes comes on when she is resting. She denies pain running down her leg  but it is localized to low back and around SIJ  PERTINENT HISTORY:  [id number], chronic back pain, GERD,  PAIN:  Are you having pain? Yes: NPRS scale: no pain currently, at worst can get to 4-5/10 Pain location: left SIJ area Pain description: needle stick Aggravating factors: prolonged walking >20 minutes or sitting >20 minutes,  Relieving factors: laying on heat pad. Sometimes stretching forward, short walks.   PRECAUTIONS: None  WEIGHT BEARING RESTRICTIONS: No  FALLS:  Has patient fallen in last 6 months? No  OCCUPATION: retired  PLOF: Independent  PATIENT GOALS: reduce pain  NEXT MD VISIT:   OBJECTIVE:   DIAGNOSTIC FINDINGS:  XR MPRESSION: 1. New/progressed retrolisthesis of L5 on S1. 2. 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  MRI has been ordered but not scheduled at time of PT eval  PATIENT SURVEYS:  Eval: FOTO 57% functional intake  SCREENING FOR RED FLAGS: Bowel or bladder incontinence: No Spinal tumors: No Cauda equina syndrome: No   COGNITION: Overall cognitive status: Within functional limits for tasks assessed     SENSATION: WFL    POSTURE: Scoliosis with Rt hip hump posteriorly in flexion, left lateral trunk shit  PALPATION: TTP over left SIJ  LUMBAR ROM:   AROM eval  Flexion WFL  Extension WFL  Right lateral flexion WFL  Left lateral flexion WFL  Right rotation WFL  Left  rotation WFL   (Blank rows = not tested)  LOWER EXTREMITY ROM:       Right eval Left eval  Hip flexion    Hip extension    Hip abduction    Hip adduction    Hip internal rotation    Hip external rotation    Knee flexion    Knee extension    Ankle dorsiflexion    Ankle plantarflexion    Ankle inversion    Ankle eversion     (Blank rows = not tested)  LOWER EXTREMITY MMT:    MMT Right eval Left eval  Hip flexion 4 3+  Hip extension    Hip abduction  3+  Hip adduction  4+  Hip internal rotation    Hip external rotation    Knee flexion 5 5  Knee extension 5 5  Ankle dorsiflexion 5 5  Ankle plantarflexion    Ankle inversion    Ankle eversion     (Blank rows = not tested)  LUMBAR SPECIAL TESTS:  Eval: Negative Slump test on left, +SLR test on left, inconclusive SIJ testing  FUNCTIONAL TESTS:    GAIT: Eval Level of assistance: Complete Independence Comments: WFL gait pattern but pain >20 minutes of walking   TODAY'S TREATMENT:  Eval Therex: HEP creation and review with demonstration and trial set preformed, see below for details, provided her illustrated copy and resistance bands    PATIENT EDUCATION: Education details: HEP, PT plan of care Person educated: Patient Education method: Explanation, Demonstration, Verbal cues, and Handouts Education comprehension: verbalized understanding and needs further education   HOME EXERCISE PROGRAM: Access Code: [email or web address] URL: [email or web address] Date: 12/15/2022 Prepared by: Ivery Quale  Exercises - Lateral Shift Correction at Wall  - 2 x daily - 6 x weekly - 1 sets - 10 reps - 5 sec hold - Seated Thoracic Flexion and Rotation with Swiss Ball  - 2 x daily - 6 x weekly - 1 sets - 10 reps - 10 sec hold - Supine Lower Trunk Rotation  - 2 x daily - 6 x weekly -  1 sets - 5 reps - 10 hold - Standing Hip Abduction with Resistance at Ankles and Counter Support  - 2 x daily - 6 x weekly - 1 sets - 15  reps - Standing Hip Flexion with Resistance Loop  - 2 x daily - 6 x weekly - 1 sets - 15 reps - Hip Extension with Resistance Loop  - 2 x daily - 6 x weekly - 1 sets - 15 reps - Single Arm Row on One Leg with Resistance (Mirrored)  - 2 x daily - 6 x weekly - 1 sets - 20 reps - Single Arm Chest Press with Resistance (Mirrored)  - 2 x daily - 6 x weekly - 1 sets - 20 reps - Supine Bridge  - 2 x daily - 6 x weekly - 1 sets - 10 reps - 5 hold  ASSESSMENT:  CLINICAL IMPRESSION: Patient referred to PT for chronic intermittent back pain with scoliosis and signs of left SIJ pain. Patient will benefit from skilled PT to address below impairments, limitations and improve overall function.  OBJECTIVE IMPAIRMENTS: decreased activity tolerance, difficulty walking,  decreased mobility, decreased ROM, decreased strength, impaired flexibility, postural dysfunction, and pain.  ACTIVITY LIMITATIONS: bending, lifting, carry, locomotion, cleaning, community activity  PERSONAL FACTORS: WUJ:[id number], chronic back pain, GERD, are also affecting patient's functional outcome.  REHAB POTENTIAL: Good  CLINICAL DECISION MAKING: Stable/uncomplicated  EVALUATION COMPLEXITY: Low    GOALS: Short term PT Goals Target date: 01/12/2023   Pt will be I and compliant with HEP. Baseline:  Goal status: New Pt will decrease pain by 25% overall Baseline: Goal status: New  Long term PT goals Target date:03/09/2023  Pt will improve  hip/knee strength to at least 4+/5 MMT to improve functional strength Baseline: Goal status: New Pt will improve FOTO to at least 64% functional to show improved function Baseline: Goal status: New Pt will reduce pain to overall less than 2-3/10 with usual activity  Baseline: Goal status: New Pt will be able to ambulate >30 minutes before onset of pain Baseline:20 minutes Goal status: New  PLAN: PT FREQUENCY: 1-2 times per week   PT DURATION: 8-12 weeks  PLANNED INTERVENTIONS  (unless contraindicated): aquatic PT, Canalith repositioning, cryotherapy, Electrical stimulation, Iontophoresis with 4 mg/ml dexamethasome, Moist heat, traction, Ultrasound, gait training, Therapeutic exercise, balance training, neuromuscular re-education, patient/family education, prosthetic training, manual techniques, passive ROM, dry needling, taping, vasopnuematic device, vestibular, spinal manipulations, joint manipulations  PLAN FOR NEXT SESSION: MD recommending Consider manual treatments, core strengthening and posture. How was HEP. Look out for MRI results    April Manson, PT,DPT 12/15/2022, 4:07 PM   PHYSICAL THERAPY DISCHARGE SUMMARY  Visits from Start of Care: 1  Current functional level related to goals / functional outcomes: See note   Remaining deficits: See note   Education / Equipment: HEP  Patient goals were not met. Patient is being discharged due to not returning since the last visit.  Chyrel Masson, PT, DPT, OCS, ATC 01/29/23  9:39 AM

## 2024-03-28 LAB — COLOGUARD: COLOGUARD: NEGATIVE

## 2024-07-28 ENCOUNTER — Other Ambulatory Visit: Payer: Self-pay | Admitting: Family Medicine

## 2024-07-28 DIAGNOSIS — Z1231 Encounter for screening mammogram for malignant neoplasm of breast: Secondary | ICD-10-CM

## 2024-07-31 ENCOUNTER — Other Ambulatory Visit: Payer: Self-pay | Admitting: Family Medicine

## 2024-07-31 DIAGNOSIS — N644 Mastodynia: Secondary | ICD-10-CM

## 2024-08-28 ENCOUNTER — Other Ambulatory Visit (HOSPITAL_BASED_OUTPATIENT_CLINIC_OR_DEPARTMENT_OTHER): Payer: Self-pay | Admitting: Family Medicine

## 2024-08-28 DIAGNOSIS — Z780 Asymptomatic menopausal state: Secondary | ICD-10-CM

## 2024-08-29 ENCOUNTER — Encounter

## 2024-08-29 ENCOUNTER — Other Ambulatory Visit

## 2024-09-06 ENCOUNTER — Other Ambulatory Visit: Payer: Self-pay | Admitting: Family Medicine

## 2024-09-06 ENCOUNTER — Ambulatory Visit
Admission: RE | Admit: 2024-09-06 | Discharge: 2024-09-06 | Disposition: A | Source: Ambulatory Visit | Attending: Family Medicine | Admitting: Family Medicine

## 2024-09-06 DIAGNOSIS — N644 Mastodynia: Secondary | ICD-10-CM
